# Patient Record
Sex: Female | Born: 1970 | Hispanic: No | Marital: Married | Smoking: Never smoker
Health system: Southern US, Community
[De-identification: ages and names within clinical notes are randomized; demographics above are authoritative.]

## PROBLEM LIST (undated history)

## (undated) DIAGNOSIS — D649 Anemia, unspecified: Secondary | ICD-10-CM

## (undated) DIAGNOSIS — R200 Anesthesia of skin: Secondary | ICD-10-CM

## (undated) DIAGNOSIS — O24419 Gestational diabetes mellitus in pregnancy, unspecified control: Secondary | ICD-10-CM

## (undated) DIAGNOSIS — D569 Thalassemia, unspecified: Secondary | ICD-10-CM

## (undated) DIAGNOSIS — N838 Other noninflammatory disorders of ovary, fallopian tube and broad ligament: Secondary | ICD-10-CM

## (undated) HISTORY — PX: OTHER SURGICAL HISTORY: SHX169

## (undated) HISTORY — PX: TUBAL LIGATION: SHX77

---

## 2010-02-22 ENCOUNTER — Other Ambulatory Visit: Payer: Self-pay | Admitting: Obstetrics and Gynecology

## 2010-02-22 DIAGNOSIS — O09529 Supervision of elderly multigravida, unspecified trimester: Secondary | ICD-10-CM

## 2010-03-02 ENCOUNTER — Ambulatory Visit (HOSPITAL_COMMUNITY)
Admission: RE | Admit: 2010-03-02 | Discharge: 2010-03-02 | Disposition: A | Discharge status: Home / Self Care | Payer: Self-pay | Source: Ambulatory Visit | Attending: Obstetrics and Gynecology | Admitting: Obstetrics and Gynecology

## 2010-03-02 ENCOUNTER — Encounter (HOSPITAL_COMMUNITY): Payer: Self-pay

## 2010-03-02 DIAGNOSIS — O09529 Supervision of elderly multigravida, unspecified trimester: Secondary | ICD-10-CM | POA: Insufficient documentation

## 2010-03-02 DIAGNOSIS — O34219 Maternal care for unspecified type scar from previous cesarean delivery: Secondary | ICD-10-CM | POA: Insufficient documentation

## 2010-03-02 DIAGNOSIS — O3500X0 Maternal care for (suspected) central nervous system malformation or damage in fetus, unspecified, not applicable or unspecified: Secondary | ICD-10-CM | POA: Insufficient documentation

## 2010-03-02 DIAGNOSIS — O350XX0 Maternal care for (suspected) central nervous system malformation in fetus, not applicable or unspecified: Secondary | ICD-10-CM | POA: Insufficient documentation

## 2010-03-02 DIAGNOSIS — Z1389 Encounter for screening for other disorder: Secondary | ICD-10-CM | POA: Insufficient documentation

## 2010-03-02 DIAGNOSIS — O358XX0 Maternal care for other (suspected) fetal abnormality and damage, not applicable or unspecified: Secondary | ICD-10-CM | POA: Insufficient documentation

## 2010-03-02 DIAGNOSIS — Z363 Encounter for antenatal screening for malformations: Secondary | ICD-10-CM | POA: Insufficient documentation

## 2010-03-02 DIAGNOSIS — O0930 Supervision of pregnancy with insufficient antenatal care, unspecified trimester: Secondary | ICD-10-CM | POA: Insufficient documentation

## 2010-03-02 IMAGING — US US OB DETAIL+14 WK
1 series · 14 of 28 positions shown · non-contrast
Comparison: none

[Series 1: us ob detail+14 wk · 14 of 80 slices shown]
[im 3/80]
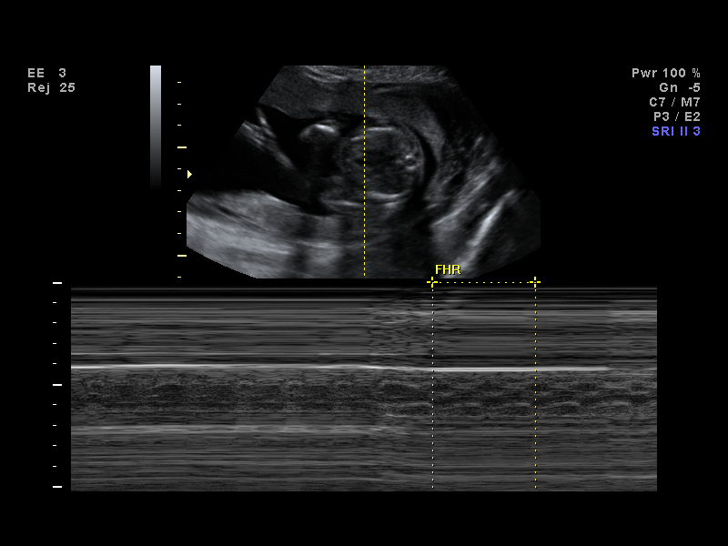
[im 9/80]
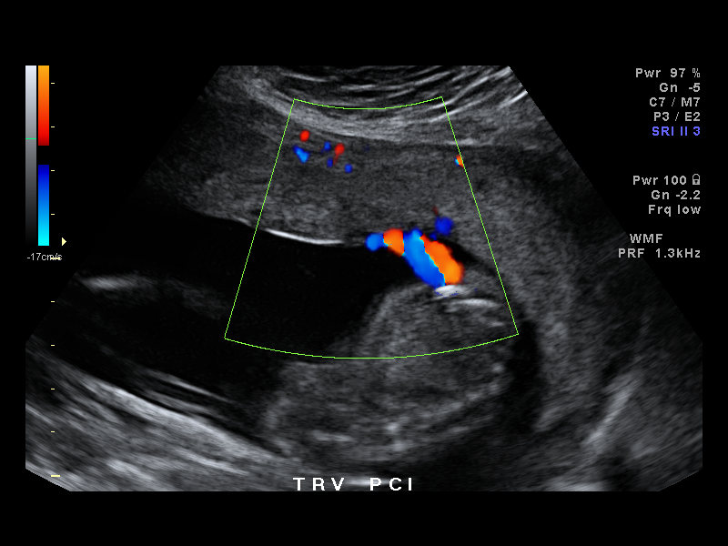
[im 15/80]
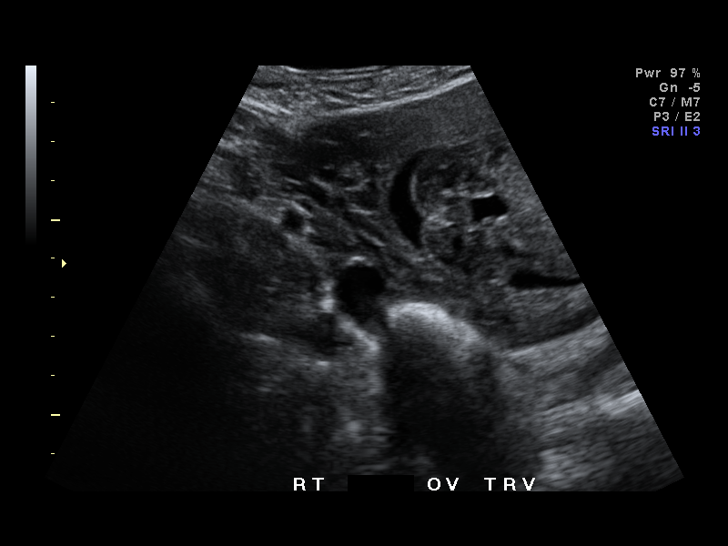
[im 21/80]
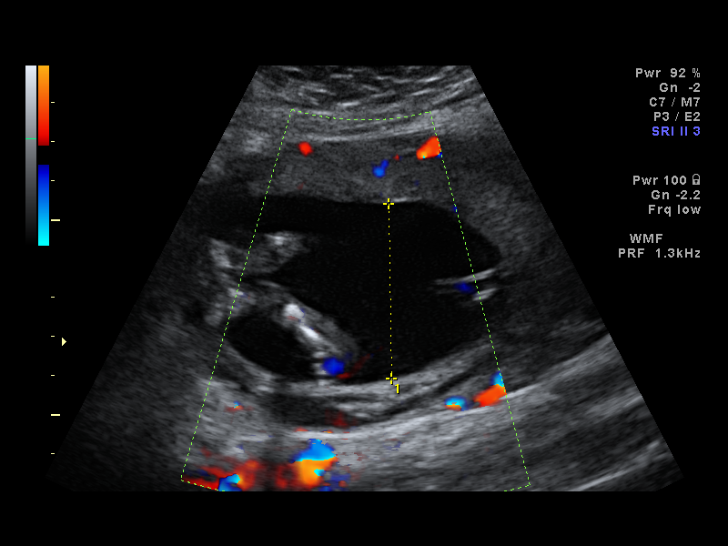
[im 27/80]
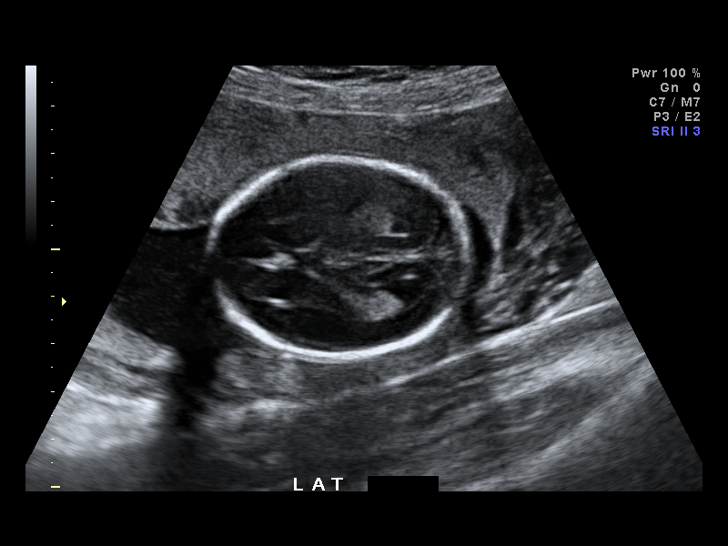
[im 33/80]
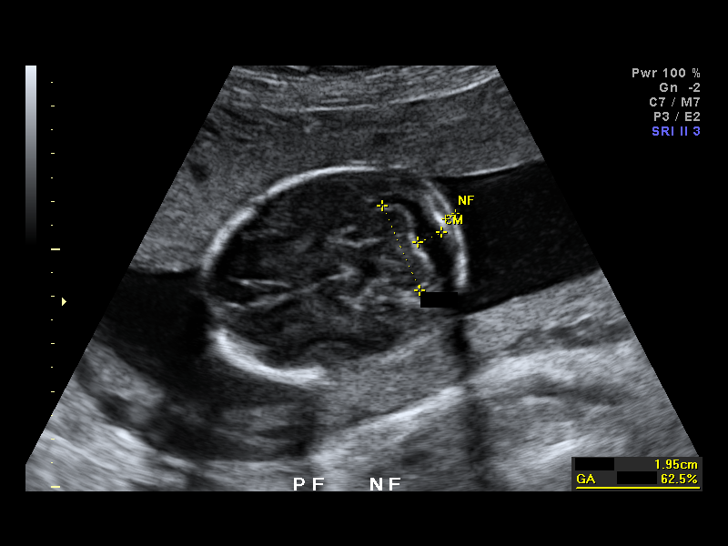
[im 39/80]
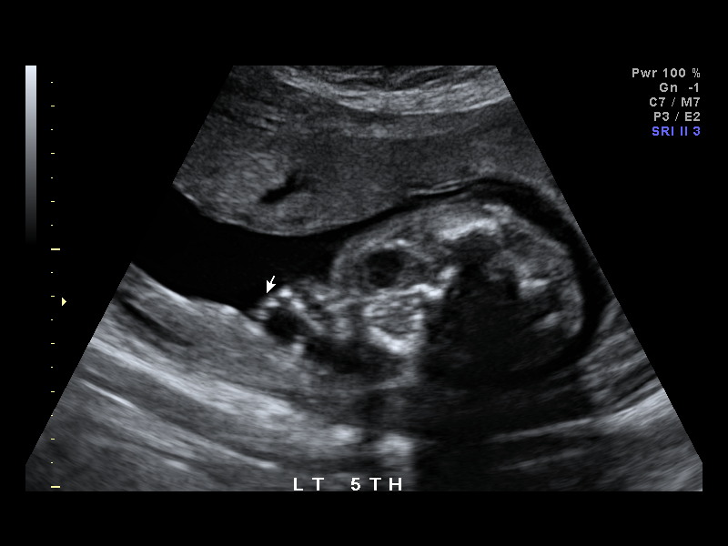
[im 44/80]
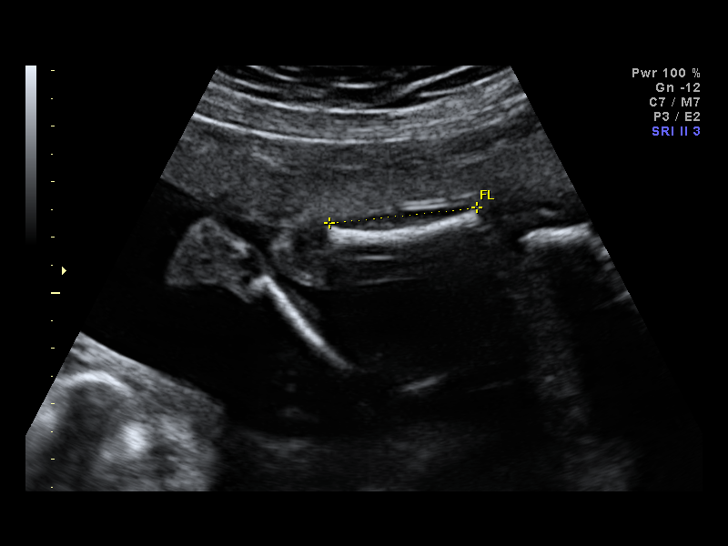
[im 50/80]
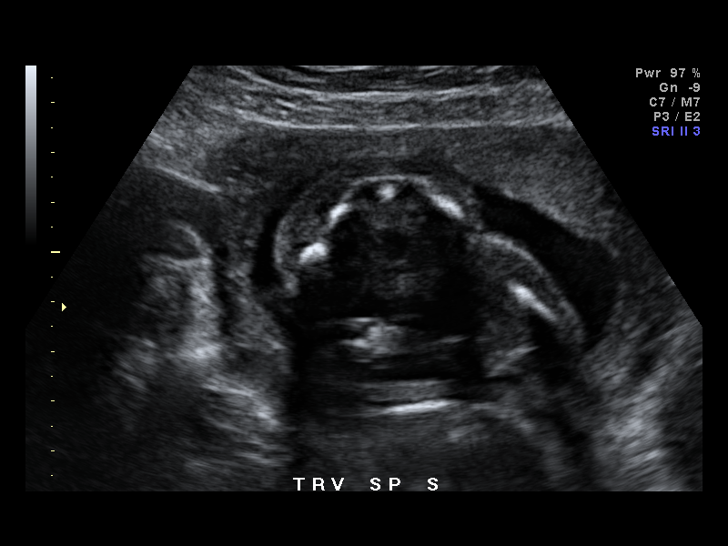
[im 56/80]
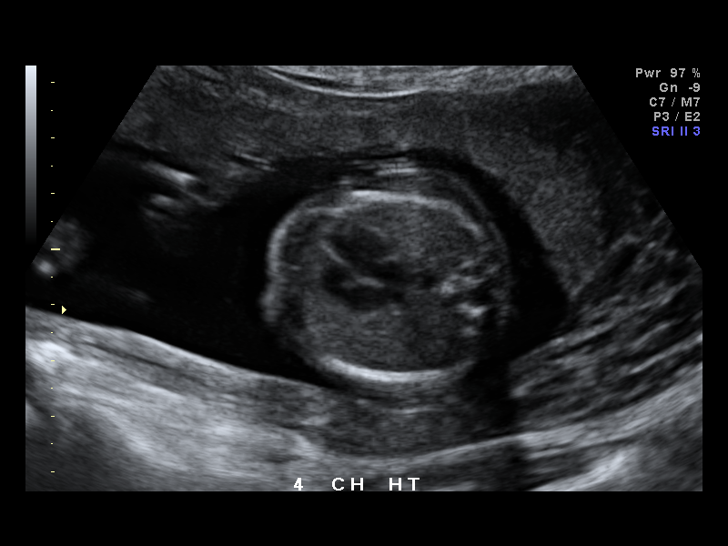
[im 62/80]
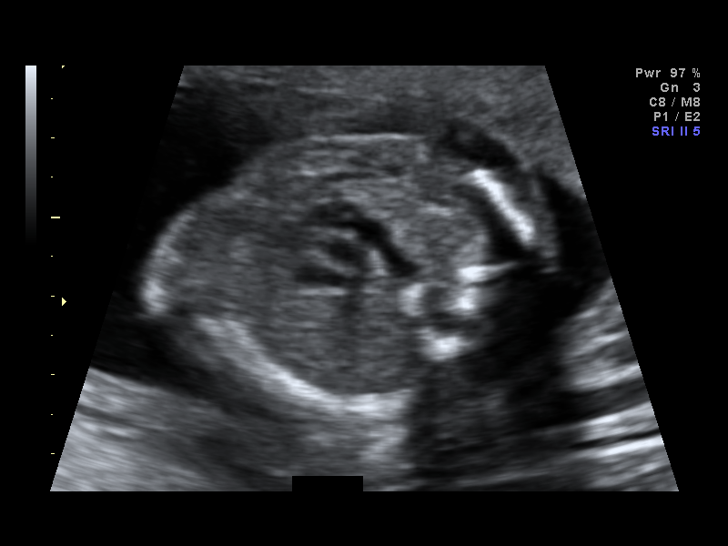
[im 68/80]
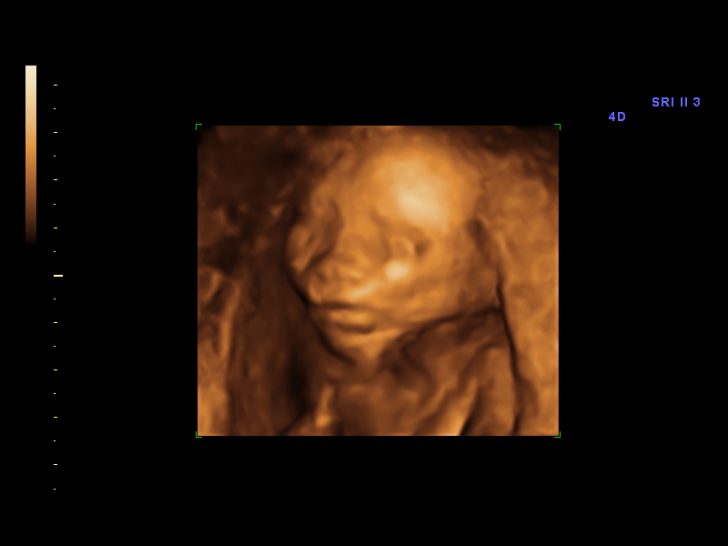
[im 74/80]
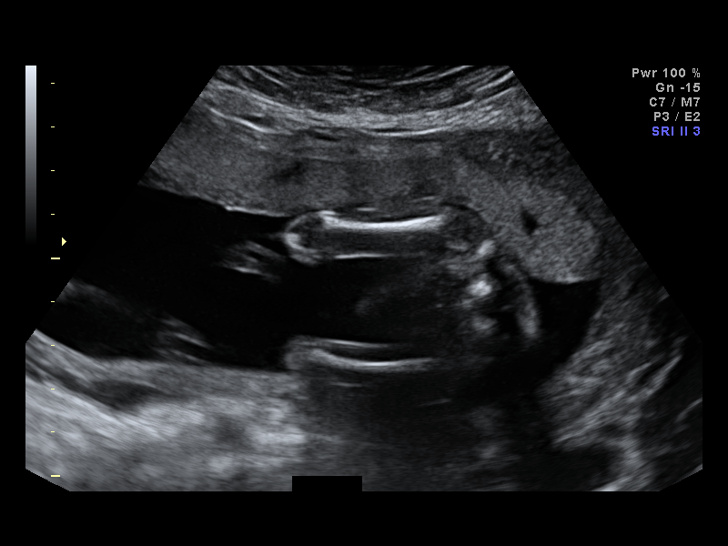
[im 80/80]
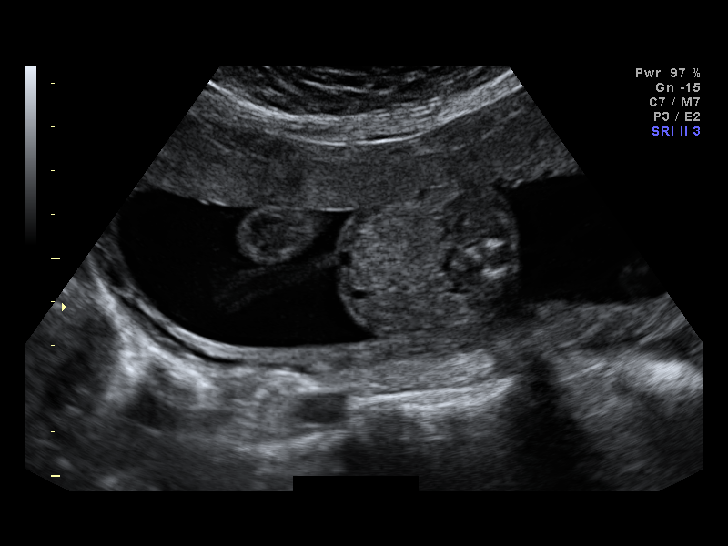

[14 of 28 positions shown; findings below may reference images not displayed]

Canned report from images found in remote index.

Refer to host system for actual result text.

## 2010-07-18 ENCOUNTER — Encounter (HOSPITAL_COMMUNITY): Payer: Self-pay

## 2010-07-18 ENCOUNTER — Encounter (HOSPITAL_COMMUNITY)
Admission: RE | Admit: 2010-07-18 | Discharge: 2010-07-18 | Disposition: A | Discharge status: Home / Self Care | Payer: Medicaid Other | Source: Ambulatory Visit | Attending: Obstetrics and Gynecology | Admitting: Obstetrics and Gynecology

## 2010-07-18 HISTORY — DX: Anemia, unspecified: D64.9

## 2010-07-18 LAB — BASIC METABOLIC PANEL
BUN: 12 mg/dL (ref 6–23)
CO2: 21 mEq/L (ref 19–32)
Chloride: 103 mEq/L (ref 96–112)
Creatinine, Ser: 0.49 mg/dL — ABNORMAL LOW (ref 0.50–1.10)
Potassium: 3.7 mEq/L (ref 3.5–5.1)

## 2010-07-18 LAB — CBC
HCT: 37.9 % (ref 36.0–46.0)
MCHC: 34 g/dL (ref 30.0–36.0)
MCV: 78.1 fL (ref 78.0–100.0)
RBC: 4.85 MIL/uL (ref 3.87–5.11)
RDW: 14.6 % (ref 11.5–15.5)
WBC: 10.9 10*3/uL — ABNORMAL HIGH (ref 4.0–10.5)

## 2010-07-18 NOTE — Patient Instructions (Addendum)
22 Jill Lee  07/18/2010 7/23  Your procedure is scheduled on:  07/24/10   9;45  Report to [facility] at 815 AM.  Call this number if you have problems the morning of surgery: [phone]   Remember:   Do not eat food:After Midnight.  Do not drink clear liquids: After Midnight.  Take these medicines the morning of surgery with A SIP OF WATER: NA   Do not wear jewelry, make-up or nail polish.  Do not bring valuables to the hospital.  Contacts, dentures or bridgework may not be worn into surgery.  Leave suitcase in the car. After surgery it may be brought to your room.  For patients admitted to the hospital, checkout time is 11:00 AM the day of discharge.   Patients discharged the day of surgery will not be allowed to drive home.  Name and phone number of your driver:NA  Special Instructions:

## 2010-07-23 MED ORDER — DEXTROSE 5 % IV SOLN
1.0000 g | INTRAVENOUS | Status: AC
  Administered 2010-07-24: 1 g via INTRAVENOUS
  Filled 2010-07-23: qty 1

## 2010-07-24 ENCOUNTER — Encounter (HOSPITAL_COMMUNITY): Payer: Self-pay | Admitting: Anesthesiology

## 2010-07-24 ENCOUNTER — Encounter (HOSPITAL_COMMUNITY): Payer: Self-pay | Admitting: *Deleted

## 2010-07-24 ENCOUNTER — Inpatient Hospital Stay (HOSPITAL_COMMUNITY)
Admission: EM | Admit: 2010-07-24 | Discharge: 2010-07-26 | DRG: 766 | Disposition: A | Discharge status: Home / Self Care | Payer: Medicaid Other | Source: Ambulatory Visit | Attending: Obstetrics and Gynecology | Admitting: Obstetrics and Gynecology

## 2010-07-24 ENCOUNTER — Inpatient Hospital Stay (HOSPITAL_COMMUNITY): Payer: Medicaid Other | Admitting: Anesthesiology

## 2010-07-24 ENCOUNTER — Encounter (HOSPITAL_COMMUNITY)
Admission: EM | Disposition: A | Discharge status: Home / Self Care | Payer: Self-pay | Source: Ambulatory Visit | Attending: Obstetrics and Gynecology

## 2010-07-24 ENCOUNTER — Other Ambulatory Visit: Payer: Self-pay | Admitting: Obstetrics and Gynecology

## 2010-07-24 DIAGNOSIS — D569 Thalassemia, unspecified: Secondary | ICD-10-CM

## 2010-07-24 DIAGNOSIS — Z01818 Encounter for other preprocedural examination: Secondary | ICD-10-CM

## 2010-07-24 DIAGNOSIS — Z302 Encounter for sterilization: Secondary | ICD-10-CM

## 2010-07-24 DIAGNOSIS — O34219 Maternal care for unspecified type scar from previous cesarean delivery: Principal | ICD-10-CM | POA: Diagnosis present

## 2010-07-24 DIAGNOSIS — O99814 Abnormal glucose complicating childbirth: Secondary | ICD-10-CM | POA: Diagnosis present

## 2010-07-24 DIAGNOSIS — Z01812 Encounter for preprocedural laboratory examination: Secondary | ICD-10-CM

## 2010-07-24 DIAGNOSIS — O09529 Supervision of elderly multigravida, unspecified trimester: Secondary | ICD-10-CM | POA: Diagnosis present

## 2010-07-24 LAB — GLUCOSE, CAPILLARY: Glucose-Capillary: 89 mg/dL (ref 70–99)

## 2010-07-24 LAB — ABO/RH: ABO/RH(D): AB POS

## 2010-07-24 SURGERY — Surgical Case
Anesthesia: Spinal

## 2010-07-24 MED ORDER — OXYTOCIN 10 UNIT/ML IJ SOLN
INTRAMUSCULAR | Status: AC
  Filled 2010-07-24: qty 2

## 2010-07-24 MED ORDER — EPHEDRINE 5 MG/ML INJ
INTRAVENOUS | Status: AC
  Filled 2010-07-24: qty 10

## 2010-07-24 MED ORDER — DIPHENHYDRAMINE HCL 50 MG/ML IJ SOLN: 12.5000 mg | INTRAMUSCULAR | Status: DC | PRN

## 2010-07-24 MED ORDER — METOCLOPRAMIDE HCL 5 MG/ML IJ SOLN: 10.0000 mg | Freq: Three times a day (TID) | INTRAMUSCULAR | Status: DC | PRN

## 2010-07-24 MED ORDER — NALBUPHINE HCL 10 MG/ML IJ SOLN
5.0000 mg | INTRAMUSCULAR | Status: AC | PRN
Start: 2010-07-24 — End: 2010-07-25
  Administered 2010-07-25: 5 mg via INTRAVENOUS
  Filled 2010-07-24 (×2): qty 1

## 2010-07-24 MED ORDER — SIMETHICONE 80 MG PO CHEW: 80.0000 mg | CHEWABLE_TABLET | Freq: Three times a day (TID) | ORAL | Status: DC | PRN

## 2010-07-24 MED ORDER — DIPHENHYDRAMINE HCL 25 MG PO CAPS: 25.0000 mg | ORAL_CAPSULE | ORAL | Status: DC | PRN

## 2010-07-24 MED ORDER — ONDANSETRON HCL 4 MG/2ML IJ SOLN
INTRAMUSCULAR | Status: AC
  Filled 2010-07-24: qty 2

## 2010-07-24 MED ORDER — LACTATED RINGERS IV SOLN: INTRAVENOUS | Status: DC | PRN

## 2010-07-24 MED ORDER — FENTANYL CITRATE 0.05 MG/ML IJ SOLN
INTRAMUSCULAR | Status: AC
  Filled 2010-07-24: qty 2

## 2010-07-24 MED ORDER — PANTOPRAZOLE SODIUM 40 MG PO TBEC: 40.0000 mg | DELAYED_RELEASE_TABLET | Freq: Once | ORAL | Status: DC

## 2010-07-24 MED ORDER — SCOPOLAMINE 1 MG/3DAYS TD PT72
MEDICATED_PATCH | TRANSDERMAL | Status: AC
  Administered 2010-07-24: 1.5 mg
  Filled 2010-07-24: qty 1

## 2010-07-24 MED ORDER — PHENYLEPHRINE HCL 10 MG/ML IJ SOLN
INTRAMUSCULAR | Status: DC | PRN
  Administered 2010-07-24 (×5): 40 ug via INTRAVENOUS

## 2010-07-24 MED ORDER — SIMETHICONE 80 MG PO CHEW
80.0000 mg | CHEWABLE_TABLET | Freq: Three times a day (TID) | ORAL | Status: DC
  Administered 2010-07-24 – 2010-07-26 (×5): 80 mg via ORAL

## 2010-07-24 MED ORDER — SODIUM CHLORIDE 0.9 % IR SOLN
Status: DC | PRN
  Administered 2010-07-24: 1000 mL

## 2010-07-24 MED ORDER — HYDROMORPHONE HCL 1 MG/ML IJ SOLN
0.5000 mg | Freq: Once | INTRAMUSCULAR | Status: AC
  Administered 2010-07-24: 0.5 mg via INTRAVENOUS

## 2010-07-24 MED ORDER — SODIUM CHLORIDE 0.9 % IV SOLN
1.0000 ug/kg/h | INTRAVENOUS | Status: DC | PRN
  Filled 2010-07-24: qty 2.5

## 2010-07-24 MED ORDER — OXYTOCIN 10 UNIT/ML IJ SOLN
INTRAMUSCULAR | Status: DC | PRN
  Administered 2010-07-24 (×2): 20 [IU]

## 2010-07-24 MED ORDER — HYDROMORPHONE HCL 1 MG/ML IJ SOLN
INTRAMUSCULAR | Status: AC
  Administered 2010-07-24: 0.5 mg via INTRAVENOUS
  Filled 2010-07-24: qty 1

## 2010-07-24 MED ORDER — MEPERIDINE HCL 25 MG/ML IJ SOLN: 6.2500 mg | INTRAMUSCULAR | Status: DC | PRN

## 2010-07-24 MED ORDER — LACTATED RINGERS IV SOLN
INTRAVENOUS | Status: DC | PRN
  Administered 2010-07-24: 11:00:00 via INTRAVENOUS

## 2010-07-24 MED ORDER — MORPHINE SULFATE 0.5 MG/ML IJ SOLN
INTRAMUSCULAR | Status: AC
  Filled 2010-07-24: qty 10

## 2010-07-24 MED ORDER — NALOXONE HCL 0.4 MG/ML IJ SOLN: 0.4000 mg | INTRAMUSCULAR | Status: DC | PRN

## 2010-07-24 MED ORDER — FAMOTIDINE 20 MG PO TABS: 20.0000 mg | ORAL_TABLET | Freq: Once | ORAL | Status: DC

## 2010-07-24 MED ORDER — NALBUPHINE HCL 10 MG/ML IJ SOLN
5.0000 mg | INTRAMUSCULAR | Status: AC | PRN
  Filled 2010-07-24: qty 1

## 2010-07-24 MED ORDER — PHENYLEPHRINE 40 MCG/ML (10ML) SYRINGE FOR IV PUSH (FOR BLOOD PRESSURE SUPPORT)
PREFILLED_SYRINGE | INTRAVENOUS | Status: AC
  Filled 2010-07-24: qty 15

## 2010-07-24 MED ORDER — LACTATED RINGERS IV SOLN
INTRAVENOUS | Status: DC
  Administered 2010-07-24: 11:00:00 via INTRAVENOUS
  Administered 2010-07-24: 1000 mL via INTRAVENOUS
  Administered 2010-07-24 (×4): via INTRAVENOUS

## 2010-07-24 MED ORDER — DIPHENHYDRAMINE HCL 50 MG/ML IJ SOLN: 25.0000 mg | INTRAMUSCULAR | Status: DC | PRN

## 2010-07-24 MED ORDER — SODIUM CHLORIDE 0.9 % IJ SOLN: 3.0000 mL | INTRAMUSCULAR | Status: DC | PRN

## 2010-07-24 MED ORDER — LACTATED RINGERS IV SOLN
INTRAVENOUS | Status: DC
  Administered 2010-07-24 – 2010-07-25 (×2): via INTRAVENOUS

## 2010-07-24 MED ORDER — SENNOSIDES-DOCUSATE SODIUM 8.6-50 MG PO TABS
1.0000 | ORAL_TABLET | Freq: Every day | ORAL | Status: DC
  Administered 2010-07-24: 2 via ORAL

## 2010-07-24 MED ORDER — ONDANSETRON HCL 4 MG/2ML IJ SOLN
INTRAMUSCULAR | Status: DC | PRN
  Administered 2010-07-24: 4 mg via INTRAVENOUS

## 2010-07-24 MED ORDER — EPHEDRINE SULFATE 50 MG/ML IJ SOLN
INTRAMUSCULAR | Status: DC | PRN
  Administered 2010-07-24: 10 mg via INTRAVENOUS
  Administered 2010-07-24: 5 mg via INTRAVENOUS
  Administered 2010-07-24: 10 mg via INTRAVENOUS
  Administered 2010-07-24: 5 mg via INTRAVENOUS

## 2010-07-24 MED ORDER — CITRIC ACID-SODIUM CITRATE 334-500 MG/5ML PO SOLN: 30.0000 mL | Freq: Once | ORAL | Status: DC

## 2010-07-24 MED ORDER — ONDANSETRON HCL 4 MG/2ML IJ SOLN: 4.0000 mg | Freq: Three times a day (TID) | INTRAMUSCULAR | Status: DC | PRN

## 2010-07-24 SURGICAL SUPPLY — 31 items
APPLICATOR CHLORAPREP 3ML ORNG (MISCELLANEOUS) ×2 IMPLANT
CLOTH BEACON ORANGE TIMEOUT ST (SAFETY) ×2 IMPLANT
DERMABOND ADVANCED (GAUZE/BANDAGES/DRESSINGS) IMPLANT
DRAPE UTILITY XL STRL (DRAPES) ×2 IMPLANT
DRESSING TELFA 8X3 (GAUZE/BANDAGES/DRESSINGS) IMPLANT
DURAPREP 26ML APPLICATOR (WOUND CARE) ×2 IMPLANT
ELECT REM PT RETURN 9FT ADLT (ELECTROSURGICAL) ×2
ELECTRODE REM PT RTRN 9FT ADLT (ELECTROSURGICAL) ×1 IMPLANT
EXTRACTOR VACUUM M CUP 4 TUBE (SUCTIONS) IMPLANT
GAUZE SPONGE 4X4 12PLY STRL LF (GAUZE/BANDAGES/DRESSINGS) IMPLANT
GLOVE BIO SURGEON STRL SZ 6.5 (GLOVE) ×2 IMPLANT
GLOVE BIOGEL PI IND STRL 7.0 (GLOVE) ×1 IMPLANT
GLOVE BIOGEL PI INDICATOR 7.0 (GLOVE) ×1
GOWN BRE IMP SLV AUR LG STRL (GOWN DISPOSABLE) ×4 IMPLANT
GOWN BRE IMP SLV AUR XL STRL (GOWN DISPOSABLE) ×2 IMPLANT
KIT ABG SYR 3ML LUER SLIP (SYRINGE) IMPLANT
NEEDLE HYPO 25X5/8 SAFETYGLIDE (NEEDLE) IMPLANT
NS IRRIG 1000ML POUR BTL (IV SOLUTION) ×2 IMPLANT
PACK C SECTION WH (CUSTOM PROCEDURE TRAY) ×2 IMPLANT
PAD ABD 7.5X8 STRL (GAUZE/BANDAGES/DRESSINGS) IMPLANT
RTRCTR C-SECT PINK 25CM LRG (MISCELLANEOUS) IMPLANT
SLEEVE SCD COMPRESS KNEE MED (MISCELLANEOUS) IMPLANT
SUT CHROMIC 2 0 CT 1 (SUTURE) ×2 IMPLANT
SUT PLAIN 2 0 (SUTURE)
SUT PLAIN 2 0 XLH (SUTURE) ×2 IMPLANT
SUT PLAIN ABS 2-0 54XMFL TIE (SUTURE) IMPLANT
SUT VIC AB 0 CT1 36 (SUTURE) ×8 IMPLANT
SUT VIC AB 4-0 KS 27 (SUTURE) ×2 IMPLANT
TOWEL OR 17X24 6PK STRL BLUE (TOWEL DISPOSABLE) ×4 IMPLANT
TRAY FOLEY CATH 14FR (SET/KITS/TRAYS/PACK) IMPLANT
WATER STERILE IRR 1000ML POUR (IV SOLUTION) ×2 IMPLANT

## 2010-07-24 NOTE — Progress Notes (Signed)
UR chart review completed.  

## 2010-07-24 NOTE — Anesthesia Procedure Notes (Addendum)
Spinal Block  Patient location during procedure: OR Staffing Anesthesiologist: JACKSON, KYLE EDWARD Performed by: anesthesiologist  Preanesthetic Checklist Completed: patient identified, site marked, surgical consent, pre-op evaluation, timeout performed, IV checked, risks and benefits discussed and monitors and equipment checked Spinal Block Patient position: sitting Prep: DuraPrep Patient monitoring: heart rate, cardiac monitor, continuous pulse ox and blood pressure Approach: midline Location: L3-4 Injection technique: single-shot Needle Needle type: Sprotte  Needle gauge: 24 G Needle length: 9 cm Assessment Sensory level: T4 Additional Notes Spinal Dosage in OR  Bupivicaine ml       1.4 PFMS04   mcg        150 Fentanyl mcg            20    

## 2010-07-24 NOTE — Anesthesia Preprocedure Evaluation (Signed)
Anesthesia Evaluation  Name, MR# and DOB Patient awake  General Assessment Comment  Reviewed: Allergy & Precautions, H&P  and Patient's Chart, lab work & pertinent test results  History of Anesthesia Complications Negative for: history of anesthetic complications  Airway Mallampati: II TM Distance: >3 FB Neck ROM: full    Dental  (+) Teeth Intact and Chipped   Pulmonaryneg pulmonary ROS      pulmonary exam normal   Cardiovascular regular Normal   Neuro/PsychBack pain with pregnancy Negative Neurological ROS Negative Psych ROS  GI/Hepatic/Renal negative GI ROS, negative Liver ROS, and negative Renal ROS (+)       Endo/Other   (+) Diabetes mellitus-, Well Controlled, Gestational  Abdominal   Musculoskeletal  Hematology  (+) Blood dyscrasia (thalassemia per chart - pt unaware of this diagnosis), ,   Peds  Reproductive/Obstetrics (+) Pregnancy   Anesthesia Other Findings Admits to toothache and cracked rear molars, but denies loose teeth            Anesthesia Physical Anesthesia Plan  ASA: II  Anesthesia Plan: Spinal   Post-op Pain Management:    Induction:   Airway Management Planned:   Additional Equipment:   Intra-op Plan:   Post-operative Plan:   Informed Consent: I have reviewed the patients History and Physical, chart, labs and discussed the procedure including the risks, benefits and alternatives for the proposed anesthesia with the patient or authorized representative who has indicated his/her understanding and acceptance.     Plan Discussed with:   Anesthesia Plan Comments:         Anesthesia Quick Evaluation

## 2010-07-24 NOTE — Anesthesia Postprocedure Evaluation (Signed)
  Anesthesia Post-op Note  Patient: Jill Lee  Procedure(s) Performed:  CESAREAN SECTION WITH BILATERAL TUBAL LIGATION - Repeat   No anesthesia complications.  Level of consciousness: alert. Cardiopulmonary status stable.  No follow-up care or observation required.  Jontae Adebayo L. Rodman Pickle, MD

## 2010-07-24 NOTE — Transfer of Care (Addendum)
Immediate Anesthesia Transfer of Care Note  Patient: Jill Lee  Procedure(s) Performed:  CESAREAN SECTION WITH BILATERAL TUBAL LIGATION - Repeat   Patient Location: PACU  Anesthesia Type: Spinal  Level of Consciousness: awake, alert  and oriented  Airway & Oxygen Therapy: Patient Spontanous Breathing  Post-op Assessment: Report given to PACU RN  Post vital signs: Reviewed and stable  Complications: No apparent anesthesia complications

## 2010-07-24 NOTE — Consult Note (Signed)
Called to attend scheduled repeat C/section at 39+wks EGA after uncomplicated pregnancy.  No labor, AROM with clear fluid at delivery.  Vertex extraction.  Infant vigorous -  No resuscitation needed. Wrapped and shown to mother, then taken to CN for further care per Dr. Anderson/[facility].  JWimmer,MD

## 2010-07-25 MED ORDER — ACETAMINOPHEN-CODEINE #3 300-30 MG PO TABS: 2.0000 | ORAL_TABLET | ORAL | Status: DC | PRN

## 2010-07-25 MED ORDER — ACETAMINOPHEN-CODEINE #3 300-30 MG PO TABS
1.0000 | ORAL_TABLET | ORAL | Status: DC | PRN
  Administered 2010-07-25 – 2010-07-26 (×5): 1 via ORAL
  Filled 2010-07-25 (×5): qty 1

## 2010-07-25 MED ORDER — BISACODYL 10 MG RE SUPP
10.0000 mg | Freq: Once | RECTAL | Status: AC
  Administered 2010-07-25: 10 mg via RECTAL
  Filled 2010-07-25: qty 1

## 2010-07-25 NOTE — Op Note (Signed)
Jill Lee, Jill Lee          ACCOUNT NO.:  [id number]  MEDICAL RECORD NO.:  [id number]  LOCATION:  9104                          FACILITY:  [facility]  PHYSICIAN:  Arlyce Harman, MD     DATE OF BIRTH:  [birth year]  DATE OF PROCEDURE:  07/24/2010 DATE OF DISCHARGE:                              OPERATIVE REPORT   PREOPERATIVE DIAGNOSES: 1. Term pregnancy 39+ weeks. 2. Sterilization. 3. Gestatational diabetes. 4. Advanced maternal age. 5. Elevated alpha-fetoprotein.  POSTOPERATIVE DIAGNOSES: 1. Previous cesarean section. 2. Gestational diabetes. 3. Elective sterilization. 4. Advanced maternal age. 5. Elevated alpha-fetoprotein.  SURGEON:  Arlyce Harman, MD  ASSISTANT:  Liberty Handy, MD  INDICATIONS AND CONCERNS:  The patient is a 40 year old gravida 2, para 1 female whose due date is July 29, 2010.  Prenatal course has been complicated by advanced maternal age, gestational diabetes insulin controlled, and an elevated alpha-fetoprotein test.  In addition, the patient has requested elective sterilization.  Options for delivery have been discussed.  Risks and possible complications of the VBAC versus repeat C-section have been discussed and informed consent is given for repeat C-section.  The patient is requesting postpartum sterilization. Risks, possible complications, failure rate, and irreversibility have been explained and informed consent is given for repeat C-section and tubal ligation.  PROCEDURE IN DETAIL:  The patient was placed on the table in a supine position with wedging after spinal anesthesia had been induced.  The abdomen was sterilely prepped and draped and the Foley catheter was inserted into the bladder.  The skin was entered in a Pfannenstiel fashion and the layers were extended into the abdomen without difficulty.  The uterus was entered in a low-transverse fashion.  The membranes were brought forth and ruptured, the fluid was clear.  The infant's  head was delivered and the nose and mouth was suctioned.  There was a cord draped around the shoulder loosely x1.  The infant was delivered.  It was a female.  The cord was clamped and cut and the infant was handed off to the pediatrician.  The placenta was manually removed. The uterus was sponged out.  At this point, the uterus was closed. First layer was a 0-Vicryl suture in a running locked fashion.  The second layer was a 0-Vicryl suture in a modified Lembert fashion.  A separate stitch was placed on the left angle of the incision for hemostasis and hemostasis was excellent.  The peritoneum was closed with 2-0 Vicryl in a running fashion.  The abdomen was irrigated and suctioned out.  Hemostasis was excellent.  The tubal ligation was then done.  The left tube was grasped with a Babcock clamp and 2-0 plain sutures were used to tie a knuckle of tube down.  A second 2-0 plain tie was placed and the tube was incised and portion of the tube excised and handed off.  The exact procedure was done on the right side and the uterus was gently replaced into the abdomen.  Again, the pelvis was irrigated and suctioned out.  The parietal peritoneum was then closed with 0-Vicryl in a running fashion.  The fascia was closed with 0-Vicryl in a running locked fashion.  The subcu was closed with 2-0  plain in a running fashion and the skin was approximated in a subcuticular fashion using 4-0 Vicryl suture.  At this point, the procedure was terminated and the patient was awakened and taken to recovery in good condition.  ESTIMATED BLOOD LOSS:  750 mL.  SPECIMEN:  Sent to labor and delivery  DISPOSITION:  Sent to labor and delivery.     Arlyce Harman, MD     EG/MEDQ  D:  07/24/2010  T:  07/25/2010  Job:  [id number]

## 2010-07-25 NOTE — Progress Notes (Signed)
Mom reports breastfeeding going well. Assisted with deeper latch. Lactation brochure reviewed with mom, community resources for breastfeeding mom discussed, advised of outpatient services if needed.

## 2010-07-25 NOTE — Progress Notes (Signed)
Subjective: Postpartum Day 1: Cesarean Delivery Patient reports tolerating PO.    Objective: Vital signs in last 24 hours: Temp:  [97.6 F (36.4 C)-98.4 F (36.9 C)] 98.4 F (36.9 C) (07/24 1100) Pulse Rate:  [69-83] 83  (07/24 1100) Resp:  [16-20] 18  (07/24 1100) BP: (94-118)/(56-75) 118/75 mmHg (07/24 1100) SpO2:  [95 %-100 %] 99 % (07/24 1100) Weight:  [65.772 kg (145 lb)] 145 lb (65.772 kg) (07/23 1547)  Physical Exam:  General: alert and no distress Lochia: appropriate Uterine Fundus: firm Incision: healing well DVT Evaluation: No evidence of DVT seen on physical exam.  No results found for this basename: HGB:2,HCT:2 in the last 72 hours  Assessment/Plan: Status post Cesarean section. Doing well postoperatively.  Continue current care.  Fortino Sic 07/25/2010, 1:38 PM

## 2010-07-26 NOTE — Progress Notes (Signed)
Patient would like to be discharged early today.  Infant has been discharged.  Notified Dr. Neva Seat who stated she was on her way to the hospital and would see the patient shortly.  Earl Gala, Linda Hedges [place]

## 2010-07-26 NOTE — Progress Notes (Signed)

## 2010-07-26 NOTE — Discharge Summary (Signed)
Obstetric Discharge Summary Reason for Admission: cesarean section Prenatal Procedures: none Intrapartum Procedures: cesarean: low cervical, transverse Postpartum Procedures: P.P. tubal ligation Complications-Operative and Postpartum: none  Hemoglobin  Date Value Range Status  07/18/2010 12.9  12.0-15.0 (g/dL) Final     HCT  Date Value Range Status  07/18/2010 37.9  36.0-46.0 (%) Final    Discharge Diagnoses: Term Pregnancy-delivered  Discharge Information: Date: 07/26/2010 Activity: pelvic rest Diet: routine Medications: Tylenol #3 Condition: stable Instructions: refer to practice specific booklet Discharge to: home   Newborn Data: Live born  Information for the patient's newborn:  Dexter, Sauser Boy Kaylea [[id number]]  female ; APGAR , ; weight ;  Home with mother.  Renly Roots E 07/26/2010, 3:35 PM

## 2010-07-26 NOTE — Progress Notes (Signed)
Subjective: Postpartum Day 2 Cesarean Delivery Patient reports no problems voiding.    Objective: Vital signs in last 24 hours: Temp:  [97.7 F (36.5 C)-98.4 F (36.9 C)] 97.7 F (36.5 C) (07/25 0603) Pulse Rate:  [78-87] 79  (07/25 0603) Resp:  [18-20] 18  (07/25 0603) BP: (107-113)/(69-75) 113/75 mmHg (07/25 0603)  Physical Exam:  General: alert Lochia: appropriate Uterine Fundus: firm Incision: healing well DVT Evaluation: No evidence of DVT seen on physical exam.  No results found for this basename: HGB:2,HCT:2 in the last 72 hours  Assessment/Plan: Status post Cesarean section. Doing well postoperatively.  Discharge home with standard precautions and return to clinic in 4-6 weeks.  Falon Flinchum E 07/26/2010, 1:34 PM

## 2010-09-15 ENCOUNTER — Encounter (HOSPITAL_COMMUNITY): Payer: Self-pay | Admitting: *Deleted

## 2013-11-02 ENCOUNTER — Encounter (HOSPITAL_COMMUNITY): Payer: Self-pay | Admitting: *Deleted

## 2015-06-08 NOTE — Progress Notes (Deleted)
Consult Note: Gyn-Onc  Consult was requested by Dr. Marland Kitchen for the evaluation of Jill Lee 45 y.o. female  CC: No chief complaint on file.   Assessment/Plan:  Ms. Jill Lee is a 45 y.o.    HPI: Ms. Jill Lee is a 45 y.o.   ***  Review of Systems:  Constitutional  Feels well,  *** Cardiovascular  No chest pain, shortness of breath, or edema  Pulmonary  No cough or wheeze.  Gastro Intestinal  No nausea, vomitting, or diarrhoea. No bright red blood per rectum, no abdominal pain, change in bowel movement, or constipation.  Genito Urinary  No frequency, urgency, dysuria, *** Musculo Skeletal  No myalgia, arthralgia, joint swelling or pain  Neurologic  No weakness, numbness, change in gait,  Psychology  No depression, anxiety, insomnia.    Current Meds:  Outpatient Encounter Prescriptions as of 06/09/2015  Medication Sig  . ferrous sulfate 325 (65 FE) MG tablet Take 325 mg by mouth daily with breakfast.     No facility-administered encounter medications on file as of 06/09/2015.    Allergy:  Allergies  Allergen Reactions  . Motrin [Ibuprofen] Itching  . Shellfish Allergy Rash    Social Hx:   Social History   Social History  . Marital Status: Married    Spouse Name: N/A  . Number of Children: N/A  . Years of Education: N/A   Occupational History  . Not on file.   Social History Main Topics  . Smoking status: Never Smoker   . Smokeless tobacco: Not on file  . Alcohol Use: No  . Drug Use: No  . Sexual Activity: Not on file   Other Topics Concern  . Not on file   Social History Narrative    Past Surgical Hx:  Past Surgical History  Procedure Laterality Date  . Cesarean section  07    Past Medical Hx:  Past Medical History  Diagnosis Date  . Diabetes mellitus   . Anemia     Past Gynecological History:  *** No LMP recorded.  Family Hx: No family history on file.  Vitals:  unknown if currently breastfeeding.  Physical  Exam: WD in NAD Neck  Supple NROM, without any enlargements.  Lymph Node Survey No cervical supraclavicular or inguinal adenopathy Cardiovascular  Pulse normal rate, regularity and rhythm. S1 and S2 normal.  Lungs  Clear to auscultation bilaterally, without wheezes/crackles/rhonchi. Good air movement.  Skin  No rash/lesions/breakdown  Psychiatry  Alert and oriented appropriate mood affect speech and reasoning. Abdomen  Normoactive bowel sounds, abdomen soft, non-tender. Surgical  sites intact without evidence of hernia.  Back No CVA tenderness Genito Urinary  Vulva/vagina: Normal external female genitalia.  No lesions. No discharge or bleeding.  Bladder/urethra:  No lesions or masses  Vagina: ***  Cervix: Normal appearing, no lesions.  Uterus: Mobile, no parametrial involvement or nodularity.  Adnexa: No palpable masses. Rectal  Good tone, no masses no cul de sac nodularity.  Extremities  No bilateral cyanosis, clubbing or edema.

## 2015-06-09 ENCOUNTER — Ambulatory Visit: Payer: Medicaid Other | Attending: Gynecologic Oncology | Admitting: Gynecologic Oncology

## 2015-06-10 NOTE — Progress Notes (Signed)
This encounter was created in error - please disregard.

## 2015-06-27 ENCOUNTER — Ambulatory Visit: Payer: BLUE CROSS/BLUE SHIELD | Attending: Gynecologic Oncology | Admitting: Gynecologic Oncology

## 2015-06-27 ENCOUNTER — Encounter: Payer: Self-pay | Admitting: Gynecologic Oncology

## 2015-06-27 VITALS — BP 123/84 | HR 74 | Temp 98.7°F | Resp 18 | Wt 128.0 lb

## 2015-06-27 DIAGNOSIS — N838 Other noninflammatory disorders of ovary, fallopian tube and broad ligament: Secondary | ICD-10-CM | POA: Insufficient documentation

## 2015-06-27 DIAGNOSIS — E119 Type 2 diabetes mellitus without complications: Secondary | ICD-10-CM | POA: Insufficient documentation

## 2015-06-27 DIAGNOSIS — Z888 Allergy status to other drugs, medicaments and biological substances status: Secondary | ICD-10-CM | POA: Diagnosis not present

## 2015-06-27 DIAGNOSIS — Z91013 Allergy to seafood: Secondary | ICD-10-CM | POA: Insufficient documentation

## 2015-06-27 DIAGNOSIS — Z9851 Tubal ligation status: Secondary | ICD-10-CM | POA: Diagnosis not present

## 2015-06-27 DIAGNOSIS — N839 Noninflammatory disorder of ovary, fallopian tube and broad ligament, unspecified: Secondary | ICD-10-CM

## 2015-06-27 DIAGNOSIS — Z9889 Other specified postprocedural states: Secondary | ICD-10-CM | POA: Insufficient documentation

## 2015-06-27 DIAGNOSIS — N83202 Unspecified ovarian cyst, left side: Secondary | ICD-10-CM | POA: Insufficient documentation

## 2015-06-27 NOTE — Progress Notes (Signed)
Consult Note: Gyn-Onc  Consult was requested by Dr. Nyoka Cowden for the evaluation of Jill Lee 45 y.o. female  CC:  Chief Complaint  Patient presents with  . Ovarian Mass    New patient    Assessment/Plan:  Jill Lee  is a 45 y.o.  year old with a large left ovarian cystic mass and normal CA 125.  I have a low suspicion for malignancy given its largely uniform appearance, and large size and normal tumor marker. However, given its size I recommend removal with frozen section and staging (including hysterectomy) if malignancy is identified.  The patient has completed child bearing and is s/p tubal ligation. She does not desire future fertility.  I discussed operative risks including  bleeding, infection, damage to internal organs (such as bladder,ureters, bowels), blood clot, reoperation and rehospitalization.  Surgery is scheduled for 07/12/15.  HPI: Jill Lee ("Nam") is a 45 year old G2P2 [place] who is seen in consultation at the request of Dr Grayland Ormond in [place] for a left ovarian mass. The patient reports noticing a protrusion or mass in her lower abdomen in May 2017. This prompted her to be evaluated by Dr. Viona Gilmore who performed a transvaginal pelvic ultrasound scan on 05/25/2015. It showed a grossly normal uterus measuring 8.9 x 3.9 x 4.9 cm, and a right ovary measuring 2.9 x 2.8 x 1.8 cm which was normal in appearance. The left ovary contained a complex 13 x 11.2 x 12.5 cm adnexal cystic mass with eccentric 4.2 x 3.0 x 4.4 cm daughter cyst with thick irregular wall and questionable internal vascularity on Doppler. There was no free fluid in the cul-de-sac. A CA-125 was drawn on 06/01/2015 and was normal at 18.9. A CEA was also normal at 1.1.  The patient is otherwise very healthy. She is a stay-at-home mom. Had 2 prior cesarean sections. She's had a tubal ligation versus her only abdominal surgery other than cesarean sections. She does  not desire future fertility.   Current Meds:  Outpatient Encounter Prescriptions as of 06/27/2015  Medication Sig  . [DISCONTINUED] ferrous sulfate 325 (65 FE) MG tablet Take 325 mg by mouth daily with breakfast.     No facility-administered encounter medications on file as of 06/27/2015.    Allergy:  Allergies  Allergen Reactions  . Motrin [Ibuprofen] Itching  . Shellfish Allergy Rash    Social Hx:   Social History   Social History  . Marital Status: Married    Spouse Name: N/A  . Number of Children: N/A  . Years of Education: N/A   Occupational History  . Not on file.   Social History Main Topics  . Smoking status: Never Smoker   . Smokeless tobacco: Not on file  . Alcohol Use: No  . Drug Use: No  . Sexual Activity: Not on file   Other Topics Concern  . Not on file   Social History Narrative    Past Surgical Hx:  Past Surgical History  Procedure Laterality Date  . Cesarean section  07    Past Medical Hx:  Past Medical History  Diagnosis Date  . Diabetes mellitus     during pregnancy  . Anemia     During pregnancy    Past Gynecological History:  C/S x 2  No LMP recorded.  Family Hx: History reviewed. No pertinent family history.  Review of Systems:  Constitutional  Feels well,    ENT Normal appearing ears and nares bilaterally Skin/Breast  No rash, sores, jaundice, itching, dryness Cardiovascular  No chest pain, shortness of breath, or edema  Pulmonary  No cough or wheeze.  Gastro Intestinal  No nausea, vomitting, or diarrhoea. No bright red blood per rectum, no abdominal pain, change in bowel movement, or constipation.  Genito Urinary  No frequency, urgency, dysuria,  Musculo Skeletal  No myalgia, arthralgia, joint swelling or pain  Neurologic  No weakness, numbness, change in gait,  Psychology  No depression, anxiety, insomnia.   Vitals:  Blood pressure 123/84, pulse 74, temperature 98.7 F (37.1 C), temperature source Oral, resp.  rate 18, weight 128 lb (58.06 kg), SpO2 100 %, unknown if currently breastfeeding.  Physical Exam: WD in NAD Neck  Supple NROM, without any enlargements.  Lymph Node Survey No cervical supraclavicular or inguinal adenopathy Cardiovascular  Pulse normal rate, regularity and rhythm. S1 and S2 normal.  Lungs  Clear to auscultation bilateraly, without wheezes/crackles/rhonchi. Good air movement.  Skin  No rash/lesions/breakdown  Psychiatry  Alert and oriented to person, place, and time  Abdomen  Normoactive bowel sounds, abdomen soft, non-tender and thin without evidence of hernia. Cystic, mobile midline mass in lower abdomen/central pelvis. Back No CVA tenderness Genito Urinary  Vulva/vagina: Normal external female genitalia.   No lesions. No discharge or bleeding.  Bladder/urethra:  No lesions or masses, well supported bladder  Vagina: normal  Cervix: Normal appearing, no lesions.  Uterus:  Small, mobile, no parametrial involvement or nodularity.  Adnexa: no masses appreciated vaginally but the abdominal mass is appreciated and moves separate from uterus. Rectal  deferred.  Extremities  No bilateral cyanosis, clubbing or edema.   Donaciano Eva, MD  06/27/2015, 11:43 AM   CC: Dr Viona Gilmore

## 2015-06-27 NOTE — Patient Instructions (Signed)
Preparing for your Surgery  Plan for surgery on July 11 , 2017 with Dr. Dr Everitt Amber  For a Robotic left Salpingo-oophorectomy with possible staging.  Pre-operative Testing -You will receive a phone call from presurgical testing at [facility] to arrange for a pre-operative testing appointment before your surgery.  This appointment normally occurs one to two weeks before your scheduled surgery.   -Bring your insurance card, copy of an advanced directive if applicable, medication list  -At that visit, you will be asked to sign a consent for a possible blood transfusion in case a transfusion becomes necessary during surgery.  The need for a blood transfusion is rare but having consent is a necessary part of your care.     -You should not be taking blood thinners or aspirin at least ten days prior to surgery unless instructed by your surgeon.  Day Before Surgery at [place] will be asked to take in a light diet the day before surgery.  Avoid carbonated beverages.  You will be advised to have nothing to eat or drink after midnight the evening before.     Eat a light diet the day before surgery.  Examples including soups, broths,  toast, yogurt, mashed potatoes.  Things to avoid include carbonated beverages  (fizzy beverages), raw fruits and raw vegetables, or beans.    If your bowels are filled with gas, your surgeon will have difficulty  visualizing your pelvic organs which increases your surgical risks.  Your role in recovery Your role is to become active as soon as directed by your doctor, while still giving yourself time to heal.  Rest when you feel tired. You will be asked to do the following in order to speed your recovery:  - Cough and breathe deeply. This helps toclear and expand your lungs and can prevent pneumonia. You may be given a spirometer to practice deep breathing. A staff member will show you how to use the spirometer. - Do mild physical activity.  Walking or moving your legs help your circulation and body functions return to normal. A staff member will help you when you try to walk and will provide you with simple exercises. Do not try to get up or walk alone the first time. - Actively manage your pain. Managing your pain lets you move in comfort. We will ask you to rate your pain on a scale of zero to 10. It is your responsibility to tell your doctor or nurse where and how much you hurt so your pain can be treated.  Special Considerations -If you are diabetic, you may be placed on insulin after surgery to have closer control over your blood sugars to promote healing and recovery.  This does not mean that you will be discharged on insulin.  If applicable, your oral antidiabetics will be resumed when you are tolerating a solid diet.  -Your final pathology results from surgery should be available by the Friday after surgery and the results will be relayed to you when available.   Blood Transfusion Information WHAT IS A BLOOD TRANSFUSION? A transfusion is the replacement of blood or some of its parts. Blood is made up of multiple cells which provide different functions.  Red blood cells carry oxygen and are used for blood loss replacement.  White blood cells fight against infection.  Platelets control bleeding.  Plasma helps clot blood.  Other blood products are available for specialized needs, such as hemophilia or other clotting disorders. BEFORE THE TRANSFUSION  Who gives blood for transfusions?   You may be able to donate blood to be used at a later date on yourself (autologous donation).  Relatives can be asked to donate blood. This is generally not any safer than if you have received blood from a stranger. The same precautions are taken to ensure safety when a relative's blood is donated.  Healthy volunteers who are fully evaluated to make sure their blood is safe. This is blood bank blood. Transfusion therapy is the safest it  has ever been in the practice of medicine. Before blood is taken from a donor, a complete history is taken to make sure that person has no history of diseases nor engages in risky social behavior (examples are intravenous drug use or sexual activity with multiple partners). The donor's travel history is screened to minimize risk of transmitting infections, such as malaria. The donated blood is tested for signs of infectious diseases, such as HIV and hepatitis. The blood is then tested to be sure it is compatible with you in order to minimize the chance of a transfusion reaction. If you or a relative donates blood, this is often done in anticipation of surgery and is not appropriate for emergency situations. It takes many days to process the donated blood. RISKS AND COMPLICATIONS Although transfusion therapy is very safe and saves many lives, the main dangers of transfusion include:   Getting an infectious disease.  Developing a transfusion reaction. This is an allergic reaction to something in the blood you were given. Every precaution is taken to prevent this. The decision to have a blood transfusion has been considered carefully by your caregiver before blood is given. Blood is not given unless the benefits outweigh the risks.

## 2015-07-08 ENCOUNTER — Encounter (HOSPITAL_COMMUNITY)
Admission: RE | Admit: 2015-07-08 | Discharge: 2015-07-08 | Disposition: A | Discharge status: Home / Self Care | Payer: BLUE CROSS/BLUE SHIELD | Source: Ambulatory Visit | Attending: Gynecologic Oncology | Admitting: Gynecologic Oncology

## 2015-07-08 ENCOUNTER — Encounter (HOSPITAL_COMMUNITY): Payer: Self-pay

## 2015-07-08 DIAGNOSIS — Z0183 Encounter for blood typing: Secondary | ICD-10-CM | POA: Insufficient documentation

## 2015-07-08 DIAGNOSIS — Z01812 Encounter for preprocedural laboratory examination: Secondary | ICD-10-CM | POA: Diagnosis present

## 2015-07-08 DIAGNOSIS — N839 Noninflammatory disorder of ovary, fallopian tube and broad ligament, unspecified: Secondary | ICD-10-CM | POA: Insufficient documentation

## 2015-07-08 HISTORY — DX: Thalassemia, unspecified: D56.9

## 2015-07-08 HISTORY — DX: Anesthesia of skin: R20.0

## 2015-07-08 HISTORY — DX: Other noninflammatory disorders of ovary, fallopian tube and broad ligament: N83.8

## 2015-07-08 HISTORY — DX: Gestational diabetes mellitus in pregnancy, unspecified control: O24.419

## 2015-07-08 LAB — COMPREHENSIVE METABOLIC PANEL
ALBUMIN: 4.4 g/dL (ref 3.5–5.0)
ALT: 21 U/L (ref 14–54)
ANION GAP: 6 (ref 5–15)
AST: 25 U/L (ref 15–41)
Alkaline Phosphatase: 53 U/L (ref 38–126)
BILIRUBIN TOTAL: 0.3 mg/dL (ref 0.3–1.2)
BUN: 15 mg/dL (ref 6–20)
CO2: 30 mmol/L (ref 22–32)
Calcium: 9.3 mg/dL (ref 8.9–10.3)
Chloride: 104 mmol/L (ref 101–111)
Creatinine, Ser: 0.59 mg/dL (ref 0.44–1.00)
GFR calc non Af Amer: >60 mL/min (ref >60)
GLUCOSE: 108 mg/dL — AB (ref 65–99)
POTASSIUM: 4 mmol/L (ref 3.5–5.1)
Sodium: 140 mmol/L (ref 135–145)
TOTAL PROTEIN: 8.4 g/dL — AB (ref 6.5–8.1)

## 2015-07-08 LAB — CBC WITH DIFFERENTIAL/PLATELET
BASOS PCT: 0 %
Basophils Absolute: 0 10*3/uL (ref 0.0–0.1)
EOS ABS: 0.1 10*3/uL (ref 0.0–0.7)
Eosinophils Relative: 1 %
HEMATOCRIT: 39.2 % (ref 36.0–46.0)
Hemoglobin: 13.5 g/dL (ref 12.0–15.0)
LYMPHS PCT: 34 %
Lymphs Abs: 2.8 10*3/uL (ref 0.7–4.0)
MCH: 25.1 pg — ABNORMAL LOW (ref 26.0–34.0)
MCHC: 34.4 g/dL (ref 30.0–36.0)
MCV: 72.9 fL — ABNORMAL LOW (ref 78.0–100.0)
MONO ABS: 0.5 10*3/uL (ref 0.1–1.0)
Monocytes Relative: 6 %
NEUTROS ABS: 4.9 10*3/uL (ref 1.7–7.7)
Neutrophils Relative %: 59 %
Platelets: 293 10*3/uL (ref 150–400)
RBC: 5.38 MIL/uL — ABNORMAL HIGH (ref 3.87–5.11)
RDW: 14.5 % (ref 11.5–15.5)
WBC: 8.3 10*3/uL (ref 4.0–10.5)

## 2015-07-08 LAB — ABO/RH: ABO/RH(D): AB POS

## 2015-07-08 LAB — HCG, SERUM, QUALITATIVE: PREG SERUM: NEGATIVE

## 2015-07-08 NOTE — Patient Instructions (Addendum)
Jill Lee  07/08/2015   Your procedure is scheduled on: Tuesday July 12, 2015  Report to [facility] Main  Entrance take [place]  elevators to 3rd floor to  [place] at 8:15 AM.  Call this number if you have problems the morning of surgery [phone]   Remember: ONLY 1 PERSON MAY GO WITH YOU TO SHORT STAY TO GET  READY MORNING OF Jill Lee.  Do not eat food or drink liquids :After Midnight.     Take these medicines the morning of surgery with A SIP OF WATER: NONE                               You may not have any metal on your body including hair pins and              piercings  Do not wear jewelry, make-up, lotions, powders or perfumes, deodorant             Do not wear nail polish.  Do not shave  48 hours prior to surgery.               Do not bring valuables to the hospital. [name].  Contacts, dentures or bridgework may not be worn into surgery.      Patients discharged the day of surgery will not be allowed to drive home.  Name and phone number of your driver:Jill Lee (husband)              FOLLOW SURGEONS INSTRUCTION IN REGARDS TO BOWEL PREPARATION PRIOR TO SURGICAL PROCEDURE DATE                Please read over the following fact sheets you were given:INCENTIVE SPIROMETER; BLOOD TRANSFUSION INFORMATION SHEET  _____________________________________________________________________             [facility] - Preparing for Surgery Before surgery, you can play an important role.  Because skin is not sterile, your skin needs to be as free of germs as possible.  You can reduce the number of germs on your skin by washing with CHG (chlorahexidine gluconate) soap before surgery.  CHG is an antiseptic cleaner which kills germs and bonds with the skin to continue killing germs even after washing. Please DO NOT use if you have an allergy to CHG or antibacterial soaps.  If your skin  becomes reddened/irritated stop using the CHG and inform your nurse when you arrive at Short Stay. Do not shave (including legs and underarms) for at least 48 hours prior to the first CHG shower.  You may shave your face/neck. Please follow these instructions carefully:  1.  Shower with CHG Soap the night before surgery and the  morning of Surgery.  2.  If you choose to wash your hair, wash your hair first as usual with your  normal  shampoo.  3.  After you shampoo, rinse your hair and body thoroughly to remove the  shampoo.                           4.  Use CHG as you would any other liquid soap.  You can apply chg directly  to the skin and  wash                       Gently with a scrungie or clean washcloth.  5.  Apply the CHG Soap to your body ONLY FROM THE NECK DOWN.   Do not use on face/ open                           Wound or open sores. Avoid contact with eyes, ears mouth and genitals (private parts).                       Wash face,  Genitals (private parts) with your normal soap.             6.  Wash thoroughly, paying special attention to the area where your surgery  will be performed.  7.  Thoroughly rinse your body with warm water from the neck down.  8.  DO NOT shower/wash with your normal soap after using and rinsing off  the CHG Soap.                9.  Pat yourself dry with a clean towel.            10.  Wear clean pajamas.            11.  Place clean sheets on your bed the night of your first shower and do not  sleep with pets. Day of Surgery : Do not apply any lotions/deodorants the morning of surgery.  Please wear clean clothes to the hospital/surgery center.  FAILURE TO FOLLOW THESE INSTRUCTIONS MAY RESULT IN THE CANCELLATION OF YOUR SURGERY PATIENT SIGNATURE_________________________________  NURSE SIGNATURE__________________________________  ________________________________________________________________________   Adam Phenix  An incentive spirometer is a  tool that can help keep your lungs clear and active. This tool measures how well you are filling your lungs with each breath. Taking long deep breaths may help reverse or decrease the chance of developing breathing (pulmonary) problems (especially infection) following:  A long period of time when you are unable to move or be active. BEFORE THE PROCEDURE   If the spirometer includes an indicator to show your best effort, your nurse or respiratory therapist will set it to a desired goal.  If possible, sit up straight or lean slightly forward. Try not to slouch.  Hold the incentive spirometer in an upright position. INSTRUCTIONS FOR USE   Sit on the edge of your bed if possible, or sit up as far as you can in bed or on a chair.  Hold the incentive spirometer in an upright position.  Breathe out normally.  Place the mouthpiece in your mouth and seal your lips tightly around it.  Breathe in slowly and as deeply as possible, raising the piston or the ball toward the top of the column.  Hold your breath for 3-5 seconds or for as long as possible. Allow the piston or ball to fall to the bottom of the column.  Remove the mouthpiece from your mouth and breathe out normally.  Rest for a few seconds and repeat Steps 1 through 7 at least 10 times every 1-2 hours when you are awake. Take your time and take a few normal breaths between deep breaths.  The spirometer may include an indicator to show your best effort. Use the indicator as a goal to work toward during each repetition.  After each set of 10 deep  breaths, practice coughing to be sure your lungs are clear. If you have an incision (the cut made at the time of surgery), support your incision when coughing by placing a pillow or rolled up towels firmly against it. Once you are able to get out of bed, walk around indoors and cough well. You may stop using the incentive spirometer when instructed by your caregiver.  RISKS AND  COMPLICATIONS  Take your time so you do not get dizzy or light-headed.  If you are in pain, you may need to take or ask for pain medication before doing incentive spirometry. It is harder to take a deep breath if you are having pain. AFTER USE  Rest and breathe slowly and easily.  It can be helpful to keep track of a log of your progress. Your caregiver can provide you with a simple table to help with this. If you are using the spirometer at home, follow these instructions: [place] IF:   You are having difficultly using the spirometer.  You have trouble using the spirometer as often as instructed.  Your pain medication is not giving enough relief while using the spirometer.  You develop fever of 100.5 F (38.1 C) or higher. SEEK IMMEDIATE MEDICAL CARE IF:   You cough up bloody sputum that had not been present before.  You develop fever of 102 F (38.9 C) or greater.  You develop worsening pain at or near the incision site. MAKE SURE YOU:   Understand these instructions.  Will watch your condition.  Will get help right away if you are not doing well or get worse. Document Released: 04/30/2006 Document Revised: 03/12/2011 Document Reviewed: 07/01/2006 ExitCare Patient Information 2014 Exit[employer], [place].   ________________________________________________________________________  WHAT IS A BLOOD TRANSFUSION? Blood Transfusion Information  A transfusion is the replacement of blood or some of its parts. Blood is made up of multiple cells which provide different functions.  Red blood cells carry oxygen and are used for blood loss replacement.  White blood cells fight against infection.  Platelets control bleeding.  Plasma helps clot blood.  Other blood products are available for specialized needs, such as hemophilia or other clotting disorders. BEFORE THE TRANSFUSION  Who gives blood for transfusions?   Healthy volunteers who are fully evaluated to make sure  their blood is safe. This is blood bank blood. Transfusion therapy is the safest it has ever been in the practice of medicine. Before blood is taken from a donor, a complete history is taken to make sure that person has no history of diseases nor engages in risky social behavior (examples are intravenous drug use or sexual activity with multiple partners). The donor's travel history is screened to minimize risk of transmitting infections, such as malaria. The donated blood is tested for signs of infectious diseases, such as HIV and hepatitis. The blood is then tested to be sure it is compatible with you in order to minimize the chance of a transfusion reaction. If you or a relative donates blood, this is often done in anticipation of surgery and is not appropriate for emergency situations. It takes many days to process the donated blood. RISKS AND COMPLICATIONS Although transfusion therapy is very safe and saves many lives, the main dangers of transfusion include:   Getting an infectious disease.  Developing a transfusion reaction. This is an allergic reaction to something in the blood you were given. Every precaution is taken to prevent this. The decision to have a blood transfusion has  been considered carefully by your caregiver before blood is given. Blood is not given unless the benefits outweigh the risks. AFTER THE TRANSFUSION  Right after receiving a blood transfusion, you will usually feel much better and more energetic. This is especially true if your red blood cells have gotten low (anemic). The transfusion raises the level of the red blood cells which carry oxygen, and this usually causes an energy increase.  The nurse administering the transfusion will monitor you carefully for complications. HOME CARE INSTRUCTIONS  No special instructions are needed after a transfusion. You may find your energy is better. Speak with your caregiver about any limitations on activity for underlying diseases  you may have. SEEK MEDICAL CARE IF:   Your condition is not improving after your transfusion.  You develop redness or irritation at the intravenous (IV) site. SEEK IMMEDIATE MEDICAL CARE IF:  Any of the following symptoms occur over the next 12 hours:  Shaking chills.  You have a temperature by mouth above 102 F (38.9 C), not controlled by medicine.  Chest, back, or muscle pain.  People around you feel you are not acting correctly or are confused.  Shortness of breath or difficulty breathing.  Dizziness and fainting.  You get a rash or develop hives.  You have a decrease in urine output.  Your urine turns a dark color or changes to pink, red, or brown. Any of the following symptoms occur over the next 10 days:  You have a temperature by mouth above 102 F (38.9 C), not controlled by medicine.  Shortness of breath.  Weakness after normal activity.  The white part of the eye turns yellow (jaundice).  You have a decrease in the amount of urine or are urinating less often.  Your urine turns a dark color or changes to pink, red, or brown. Document Released: 12/16/1999 Document Revised: 03/12/2011 Document Reviewed: 08/04/2007 [facility] Patient Information 2014 [place], [place].  _______________________________________________________________________

## 2015-07-12 ENCOUNTER — Encounter (HOSPITAL_COMMUNITY)
Admission: RE | Disposition: A | Discharge status: Home / Self Care | Payer: Self-pay | Source: Ambulatory Visit | Attending: Gynecologic Oncology

## 2015-07-12 ENCOUNTER — Ambulatory Visit (HOSPITAL_COMMUNITY): Payer: BLUE CROSS/BLUE SHIELD | Admitting: Anesthesiology

## 2015-07-12 ENCOUNTER — Encounter (HOSPITAL_COMMUNITY): Payer: Self-pay | Admitting: *Deleted

## 2015-07-12 ENCOUNTER — Ambulatory Visit (HOSPITAL_COMMUNITY)
Admission: RE | Admit: 2015-07-12 | Discharge: 2015-07-12 | Disposition: A | Discharge status: Home / Self Care | Payer: BLUE CROSS/BLUE SHIELD | Source: Ambulatory Visit | Attending: Gynecologic Oncology | Admitting: Gynecologic Oncology

## 2015-07-12 DIAGNOSIS — N838 Other noninflammatory disorders of ovary, fallopian tube and broad ligament: Secondary | ICD-10-CM | POA: Diagnosis present

## 2015-07-12 DIAGNOSIS — D271 Benign neoplasm of left ovary: Secondary | ICD-10-CM | POA: Diagnosis not present

## 2015-07-12 DIAGNOSIS — N83202 Unspecified ovarian cyst, left side: Secondary | ICD-10-CM | POA: Diagnosis not present

## 2015-07-12 HISTORY — PX: ROBOTIC ASSISTED SALPINGO OOPHERECTOMY: SHX6082

## 2015-07-12 LAB — TYPE AND SCREEN
ABO/RH(D): AB POS
ANTIBODY SCREEN: NEGATIVE

## 2015-07-12 SURGERY — SALPINGO-OOPHORECTOMY, ROBOT-ASSISTED
Anesthesia: General | Laterality: Left

## 2015-07-12 MED ORDER — SUGAMMADEX SODIUM 200 MG/2ML IV SOLN
INTRAVENOUS | Status: DC | PRN
  Administered 2015-07-12: 200 mg via INTRAVENOUS

## 2015-07-12 MED ORDER — MIDAZOLAM HCL 2 MG/2ML IJ SOLN
INTRAMUSCULAR | Status: AC
  Filled 2015-07-12: qty 2

## 2015-07-12 MED ORDER — SUGAMMADEX SODIUM 200 MG/2ML IV SOLN
INTRAVENOUS | Status: AC
  Filled 2015-07-12: qty 2

## 2015-07-12 MED ORDER — OXYCODONE-ACETAMINOPHEN 10-325 MG PO TABS: 1.0000 | ORAL_TABLET | ORAL | Status: AC | PRN

## 2015-07-12 MED ORDER — MORPHINE SULFATE (PF) 10 MG/ML IV SOLN: 2.0000 mg | INTRAVENOUS | Status: DC | PRN

## 2015-07-12 MED ORDER — ONDANSETRON HCL 4 MG/2ML IJ SOLN
INTRAMUSCULAR | Status: DC | PRN
  Administered 2015-07-12: 4 mg via INTRAVENOUS

## 2015-07-12 MED ORDER — ACETAMINOPHEN 650 MG RE SUPP
650.0000 mg | RECTAL | Status: DC | PRN
  Filled 2015-07-12: qty 1

## 2015-07-12 MED ORDER — ONDANSETRON HCL 4 MG/2ML IJ SOLN: 4.0000 mg | Freq: Once | INTRAMUSCULAR | Status: DC | PRN

## 2015-07-12 MED ORDER — HYDROMORPHONE HCL 1 MG/ML IJ SOLN
0.2500 mg | INTRAMUSCULAR | Status: DC | PRN
Start: 2015-07-12 — End: 2015-07-12
  Administered 2015-07-12: 0.5 mg via INTRAVENOUS
  Administered 2015-07-12 (×2): 0.25 mg via INTRAVENOUS

## 2015-07-12 MED ORDER — PROPOFOL 10 MG/ML IV BOLUS
INTRAVENOUS | Status: AC
  Filled 2015-07-12: qty 20

## 2015-07-12 MED ORDER — ROCURONIUM BROMIDE 100 MG/10ML IV SOLN
INTRAVENOUS | Status: AC
  Filled 2015-07-12: qty 1

## 2015-07-12 MED ORDER — CEFAZOLIN SODIUM-DEXTROSE 2-4 GM/100ML-% IV SOLN
2.0000 g | INTRAVENOUS | Status: AC
  Administered 2015-07-12: 2 g via INTRAVENOUS
  Filled 2015-07-12: qty 100

## 2015-07-12 MED ORDER — BUPIVACAINE HCL 0.25 % IJ SOLN
INTRAMUSCULAR | Status: DC | PRN
  Administered 2015-07-12: 17 mL

## 2015-07-12 MED ORDER — FENTANYL CITRATE (PF) 100 MCG/2ML IJ SOLN
INTRAMUSCULAR | Status: DC | PRN
  Administered 2015-07-12: 25 ug via INTRAVENOUS
  Administered 2015-07-12: 50 ug via INTRAVENOUS
  Administered 2015-07-12: 25 ug via INTRAVENOUS
  Administered 2015-07-12: 50 ug via INTRAVENOUS

## 2015-07-12 MED ORDER — SODIUM CHLORIDE 0.9% FLUSH: 3.0000 mL | INTRAVENOUS | Status: DC | PRN

## 2015-07-12 MED ORDER — LACTATED RINGERS IV SOLN
INTRAVENOUS | Status: DC | PRN
  Administered 2015-07-12: 1000 mL

## 2015-07-12 MED ORDER — BUPIVACAINE HCL (PF) 0.25 % IJ SOLN
INTRAMUSCULAR | Status: AC
  Filled 2015-07-12: qty 30

## 2015-07-12 MED ORDER — HYDROMORPHONE HCL 1 MG/ML IJ SOLN
INTRAMUSCULAR | Status: AC
  Filled 2015-07-12: qty 1

## 2015-07-12 MED ORDER — SODIUM CHLORIDE 0.9 % IV SOLN: 250.0000 mL | INTRAVENOUS | Status: DC | PRN

## 2015-07-12 MED ORDER — DEXAMETHASONE SODIUM PHOSPHATE 10 MG/ML IJ SOLN
INTRAMUSCULAR | Status: DC | PRN
  Administered 2015-07-12: 10 mg via INTRAVENOUS

## 2015-07-12 MED ORDER — ACETAMINOPHEN 500 MG PO TABS
1000.0000 mg | ORAL_TABLET | Freq: Four times a day (QID) | ORAL | Status: DC
  Administered 2015-07-12: 1000 mg via ORAL
  Filled 2015-07-12 (×5): qty 2

## 2015-07-12 MED ORDER — ROCURONIUM BROMIDE 100 MG/10ML IV SOLN
INTRAVENOUS | Status: DC | PRN
  Administered 2015-07-12: 40 mg via INTRAVENOUS

## 2015-07-12 MED ORDER — CEFAZOLIN SODIUM-DEXTROSE 2-4 GM/100ML-% IV SOLN
INTRAVENOUS | Status: AC
  Filled 2015-07-12: qty 100

## 2015-07-12 MED ORDER — LIDOCAINE HCL (CARDIAC) 20 MG/ML IV SOLN
INTRAVENOUS | Status: DC | PRN
  Administered 2015-07-12: 50 mg via INTRAVENOUS

## 2015-07-12 MED ORDER — FENTANYL CITRATE (PF) 250 MCG/5ML IJ SOLN
INTRAMUSCULAR | Status: AC
  Filled 2015-07-12: qty 5

## 2015-07-12 MED ORDER — SODIUM CHLORIDE 0.9% FLUSH: 3.0000 mL | Freq: Two times a day (BID) | INTRAVENOUS | Status: DC

## 2015-07-12 MED ORDER — OXYCODONE HCL 5 MG PO TABS: 5.0000 mg | ORAL_TABLET | ORAL | Status: DC | PRN

## 2015-07-12 MED ORDER — ACETAMINOPHEN 325 MG PO TABS: 650.0000 mg | ORAL_TABLET | ORAL | Status: DC | PRN

## 2015-07-12 MED ORDER — STERILE WATER FOR IRRIGATION IR SOLN
Status: DC | PRN
  Administered 2015-07-12: 1000 mL

## 2015-07-12 MED ORDER — MIDAZOLAM HCL 5 MG/5ML IJ SOLN
INTRAMUSCULAR | Status: DC | PRN
  Administered 2015-07-12: 2 mg via INTRAVENOUS

## 2015-07-12 MED ORDER — LACTATED RINGERS IV SOLN
INTRAVENOUS | Status: DC | PRN
  Administered 2015-07-12 (×2): via INTRAVENOUS

## 2015-07-12 MED ORDER — MEPERIDINE HCL 50 MG/ML IJ SOLN: 6.2500 mg | INTRAMUSCULAR | Status: DC | PRN

## 2015-07-12 MED ORDER — ONDANSETRON HCL 4 MG/2ML IJ SOLN
INTRAMUSCULAR | Status: AC
  Filled 2015-07-12: qty 2

## 2015-07-12 MED ORDER — LIDOCAINE HCL (CARDIAC) 20 MG/ML IV SOLN
INTRAVENOUS | Status: AC
  Filled 2015-07-12: qty 5

## 2015-07-12 MED ORDER — PROPOFOL 10 MG/ML IV BOLUS
INTRAVENOUS | Status: DC | PRN
Start: 2015-07-12 — End: 2015-07-12
  Administered 2015-07-12: 110 mg via INTRAVENOUS

## 2015-07-12 SURGICAL SUPPLY — 46 items
CHLORAPREP W/TINT 26ML (MISCELLANEOUS) ×2 IMPLANT
COVER SURGICAL LIGHT HANDLE (MISCELLANEOUS) ×2 IMPLANT
COVER TIP SHEARS 8 DVNC (MISCELLANEOUS) ×1 IMPLANT
COVER TIP SHEARS 8MM DA VINCI (MISCELLANEOUS) ×1
DRAPE ARM DVNC X/XI (DISPOSABLE) ×4 IMPLANT
DRAPE COLUMN DVNC XI (DISPOSABLE) ×1 IMPLANT
DRAPE DA VINCI XI ARM (DISPOSABLE) ×4
DRAPE DA VINCI XI COLUMN (DISPOSABLE) ×1
DRAPE SHEET LG 3/4 BI-LAMINATE (DRAPES) ×4 IMPLANT
DRAPE SURG IRRIG POUCH 19X23 (DRAPES) ×2 IMPLANT
ELECT REM PT RETURN 9FT ADLT (ELECTROSURGICAL) ×2
ELECTRODE REM PT RTRN 9FT ADLT (ELECTROSURGICAL) ×1 IMPLANT
GLOVE BIO SURGEON STRL SZ 6 (GLOVE) ×8 IMPLANT
GLOVE BIO SURGEON STRL SZ 6.5 (GLOVE) ×4 IMPLANT
GOWN STRL REUS W/ TWL LRG LVL3 (GOWN DISPOSABLE) ×3 IMPLANT
GOWN STRL REUS W/TWL LRG LVL3 (GOWN DISPOSABLE) ×3
HOLDER FOLEY CATH W/STRAP (MISCELLANEOUS) ×2 IMPLANT
KIT BASIN OR (CUSTOM PROCEDURE TRAY) ×2 IMPLANT
LIQUID BAND (GAUZE/BANDAGES/DRESSINGS) ×2 IMPLANT
MANIPULATOR UTERINE 4.5 ZUMI (MISCELLANEOUS) IMPLANT
MARKER SKIN DUAL TIP RULER LAB (MISCELLANEOUS) ×2 IMPLANT
OBTURATOR XI 8MM BLADELESS (TROCAR) ×2 IMPLANT
OCCLUDER COLPOPNEUMO (BALLOONS) IMPLANT
PAD POSITIONING PINK XL (MISCELLANEOUS) ×2 IMPLANT
PORT ACCESS TROCAR AIRSEAL 12 (TROCAR) ×1 IMPLANT
PORT ACCESS TROCAR AIRSEAL 5M (TROCAR) ×1
POUCH ENDO CATCH II 15MM (MISCELLANEOUS) IMPLANT
POUCH SPECIMEN RETRIEVAL 10MM (ENDOMECHANICALS) IMPLANT
SEAL CANN UNIV 5-8 DVNC XI (MISCELLANEOUS) ×4 IMPLANT
SEAL XI 5MM-8MM UNIVERSAL (MISCELLANEOUS) ×4
SET TRI-LUMEN FLTR TB AIRSEAL (TUBING) IMPLANT
SET TUBE IRRIG SUCTION NO TIP (IRRIGATION / IRRIGATOR) ×2 IMPLANT
SHEET LAVH (DRAPES) ×2 IMPLANT
SOLUTION ELECTROLUBE (MISCELLANEOUS) ×2 IMPLANT
SUT MNCRL AB 4-0 PS2 18 (SUTURE) ×4 IMPLANT
SUT VIC AB 0 CT1 27 (SUTURE)
SUT VIC AB 0 CT1 27XBRD ANTBC (SUTURE) IMPLANT
SYR 50ML LL SCALE MARK (SYRINGE) IMPLANT
TOWEL OR 17X26 10 PK STRL BLUE (TOWEL DISPOSABLE) ×4 IMPLANT
TOWEL OR NON WOVEN STRL DISP B (DISPOSABLE) ×2 IMPLANT
TRAP SPECIMEN MUCOUS 40CC (MISCELLANEOUS) IMPLANT
TRAY FOLEY W/METER SILVER 14FR (SET/KITS/TRAYS/PACK) ×2 IMPLANT
TRAY LAPAROSCOPIC (CUSTOM PROCEDURE TRAY) ×2 IMPLANT
TROCAR BLADELESS OPT 5 100 (ENDOMECHANICALS) ×2 IMPLANT
UNDERPAD 30X30 INCONTINENT (UNDERPADS AND DIAPERS) ×2 IMPLANT
WATER STERILE IRR 1500ML POUR (IV SOLUTION) ×2 IMPLANT

## 2015-07-12 NOTE — Discharge Instructions (Signed)
Unilateral Salpingo-Oophorectomy, Care After Refer to this sheet in the next few weeks. These instructions provide you with information on caring for yourself after your procedure. Your health care provider may also give you more specific instructions. Your treatment has been planned according to current medical practices, but problems sometimes occur. Call your health care provider if you have any problems or questions after your procedure. WHAT TO EXPECT AFTER THE PROCEDURE After your procedure, it is typical to have the following:  Abdominal pain that can be controlled with pain medicine.  Vaginal spotting.  Constipation. HOME CARE INSTRUCTIONS   Get plenty of rest and sleep.  Only take over-the-counter or prescription medicines as directed by your health care provider. Do not take aspirin. It can cause bleeding.  Keep incision areas clean and dry. Remove or change any bandages (dressings) only as directed by your health care provider.  Follow your health care provider's advice regarding diet.  Drink enough fluids to keep your urine clear or pale yellow.  Limit exercise and activities as directed by your health care provider. Do not lift anything heavier than 5 pounds (2.3 kg) until your health care provider approves.  Do not drive until your health care provider approves.  Do not drink alcohol until your health care provider approves.  Do not have sexual intercourse until your health care provider says it is OK.  Take your temperature twice a day and write it down.  If you become constipated, you may:  Ask your health care provider about taking a mild laxative.  Add more fruit and bran to your diet.  Drink more fluids.  Follow up with your health care provider as directed. SEEK MEDICAL CARE IF:   You have swelling or redness in the incision area.  You develop a rash.  You feel lightheaded.  You have pain that is not controlled with medicine.  You have pain,  swelling, or redness where the IV access tube was placed. SEEK IMMEDIATE MEDICAL CARE IF:  You have a fever.  You develop increasing abdominal pain.  You see pus coming out of the incision, or the incision is separating.  You notice a bad smell coming from the wound or dressing.  You have excessive vaginal bleeding.  You feel sick to your stomach (nauseous) and vomit.  You have leg or chest pain.  You have pain when you urinate.  You develop shortness of breath.  You pass out.   This information is not intended to replace advice given to you by your health care provider. Make sure you discuss any questions you have with your health care provider.   Document Released: 10/14/2008 Document Revised: 10/08/2012 Document Reviewed: 06/11/2012 Elsevier Interactive Patient Education 2016 Dry Prong Anesthesia, Adult, Care After Refer to this sheet in the next few weeks. These instructions provide you with information on caring for yourself after your procedure. Your health care provider may also give you more specific instructions. Your treatment has been planned according to current medical practices, but problems sometimes occur. Call your health care provider if you have any problems or questions after your procedure. WHAT TO EXPECT AFTER THE PROCEDURE After the procedure, it is typical to experience:  Sleepiness.  Nausea and vomiting. HOME CARE INSTRUCTIONS  For the first 24 hours after general anesthesia:  Have a responsible person with you.  Do not drive a car. If you are alone, do not take public transportation.  Do not drink alcohol.  Do not take medicine that  has not been prescribed by your health care provider.  Do not sign important papers or make important decisions.  You may resume a normal diet and activities as directed by your health care provider.  Change bandages (dressings) as directed.  If you have questions or problems that seem related to  general anesthesia, call the hospital and ask for the anesthetist or anesthesiologist on call. SEEK MEDICAL CARE IF:  You have nausea and vomiting that continue the day after anesthesia.  You develop a rash. SEEK IMMEDIATE MEDICAL CARE IF:   You have difficulty breathing.  You have chest pain.  You have any allergic problems.   This information is not intended to replace advice given to you by your health care provider. Make sure you discuss any questions you have with your health care provider.   Document Released: 03/26/2000 Document Revised: 01/08/2014 Document Reviewed: 04/18/2011 Elsevier Interactive Patient Education [employer].

## 2015-07-12 NOTE — H&P (View-Only) (Signed)
Consult Note: Gyn-Onc  Consult was requested by Dr. Nyoka Cowden for the evaluation of Jill Lee 45 y.o. female  CC:  Chief Complaint  Patient presents with  . Ovarian Mass    New patient    Assessment/Plan:  Ms. Jill Lee  is a 45 y.o.  year old with a large left ovarian cystic mass and normal CA 125.  I have a low suspicion for malignancy given its largely uniform appearance, and large size and normal tumor marker. However, given its size I recommend removal with frozen section and staging (including hysterectomy) if malignancy is identified.  The patient has completed child bearing and is s/p tubal ligation. She does not desire future fertility.  I discussed operative risks including  bleeding, infection, damage to internal organs (such as bladder,ureters, bowels), blood clot, reoperation and rehospitalization.  Surgery is scheduled for 07/12/15.  HPI: Jill Lee ("Jill Lee") is a 45 year old G2P2 [place] who is seen in consultation at the request of Dr Grayland Ormond in [place] for a left ovarian mass. The patient reports noticing a protrusion or mass in her lower abdomen in May 2017. This prompted her to be evaluated by Dr. Viona Gilmore who performed a transvaginal pelvic ultrasound scan on 05/25/2015. It showed a grossly normal uterus measuring 8.9 x 3.9 x 4.9 cm, and a right ovary measuring 2.9 x 2.8 x 1.8 cm which was normal in appearance. The left ovary contained a complex 13 x 11.2 x 12.5 cm adnexal cystic mass with eccentric 4.2 x 3.0 x 4.4 cm daughter cyst with thick irregular wall and questionable internal vascularity on Doppler. There was no free fluid in the cul-de-sac. A CA-125 was drawn on 06/01/2015 and was normal at 18.9. A CEA was also normal at 1.1.  The patient is otherwise very healthy. She is a stay-at-home mom. Had 2 prior cesarean sections. She's had a tubal ligation versus her only abdominal surgery other than cesarean sections. She does  not desire future fertility.   Current Meds:  Outpatient Encounter Prescriptions as of 06/27/2015  Medication Sig  . [DISCONTINUED] ferrous sulfate 325 (65 FE) MG tablet Take 325 mg by mouth daily with breakfast.     No facility-administered encounter medications on file as of 06/27/2015.    Allergy:  Allergies  Allergen Reactions  . Motrin [Ibuprofen] Itching  . Shellfish Allergy Rash    Social Hx:   Social History   Social History  . Marital Status: Married    Spouse Name: N/A  . Number of Children: N/A  . Years of Education: N/A   Occupational History  . Not on file.   Social History Main Topics  . Smoking status: Never Smoker   . Smokeless tobacco: Not on file  . Alcohol Use: No  . Drug Use: No  . Sexual Activity: Not on file   Other Topics Concern  . Not on file   Social History Narrative    Past Surgical Hx:  Past Surgical History  Procedure Laterality Date  . Cesarean section  07    Past Medical Hx:  Past Medical History  Diagnosis Date  . Diabetes mellitus     during pregnancy  . Anemia     During pregnancy    Past Gynecological History:  C/S x 2  No LMP recorded.  Family Hx: History reviewed. No pertinent family history.  Review of Systems:  Constitutional  Feels well,    ENT Normal appearing ears and nares bilaterally Skin/Breast  No rash, sores, jaundice, itching, dryness Cardiovascular  No chest pain, shortness of breath, or edema  Pulmonary  No cough or wheeze.  Gastro Intestinal  No nausea, vomitting, or diarrhoea. No bright red blood per rectum, no abdominal pain, change in bowel movement, or constipation.  Genito Urinary  No frequency, urgency, dysuria,  Musculo Skeletal  No myalgia, arthralgia, joint swelling or pain  Neurologic  No weakness, numbness, change in gait,  Psychology  No depression, anxiety, insomnia.   Vitals:  Blood pressure 123/84, pulse 74, temperature 98.7 F (37.1 C), temperature source Oral, resp.  rate 18, weight 128 lb (58.06 kg), SpO2 100 %, unknown if currently breastfeeding.  Physical Exam: WD in NAD Neck  Supple NROM, without any enlargements.  Lymph Node Survey No cervical supraclavicular or inguinal adenopathy Cardiovascular  Pulse normal rate, regularity and rhythm. S1 and S2 normal.  Lungs  Clear to auscultation bilateraly, without wheezes/crackles/rhonchi. Good air movement.  Skin  No rash/lesions/breakdown  Psychiatry  Alert and oriented to person, place, and time  Abdomen  Normoactive bowel sounds, abdomen soft, non-tender and thin without evidence of hernia. Cystic, mobile midline mass in lower abdomen/central pelvis. Back No CVA tenderness Genito Urinary  Vulva/vagina: Normal external female genitalia.   No lesions. No discharge or bleeding.  Bladder/urethra:  No lesions or masses, well supported bladder  Vagina: normal  Cervix: Normal appearing, no lesions.  Uterus:  Small, mobile, no parametrial involvement or nodularity.  Adnexa: no masses appreciated vaginally but the abdominal mass is appreciated and moves separate from uterus. Rectal  deferred.  Extremities  No bilateral cyanosis, clubbing or edema.   Donaciano Eva, MD  06/27/2015, 11:43 AM   CC: Dr Viona Gilmore

## 2015-07-12 NOTE — Anesthesia Preprocedure Evaluation (Addendum)
Anesthesia Evaluation  Patient identified by MRN, date of birth, ID band Patient awake    Reviewed: Allergy & Precautions, NPO status , Patient's Chart, lab work & pertinent test results  Airway Mallampati: I  TM Distance: >3 FB Neck ROM: Full    Dental   Pulmonary    Pulmonary exam normal        Cardiovascular Normal cardiovascular exam     Neuro/Psych    GI/Hepatic   Endo/Other  diabetes  Renal/GU      Musculoskeletal   Abdominal   Peds  Hematology   Anesthesia Other Findings   Reproductive/Obstetrics                           Anesthesia Physical Anesthesia Plan  ASA: II  Anesthesia Plan: General   Post-op Pain Management:    Induction: Intravenous  Airway Management Planned: Oral ETT  Additional Equipment:   Intra-op Plan:   Post-operative Plan: Extubation in OR  Informed Consent: I have reviewed the patients History and Physical, chart, labs and discussed the procedure including the risks, benefits and alternatives for the proposed anesthesia with the patient or authorized representative who has indicated his/her understanding and acceptance.     Plan Discussed with: CRNA and Surgeon  Anesthesia Plan Comments:         Anesthesia Quick Evaluation

## 2015-07-12 NOTE — Op Note (Signed)
OPERATIVE NOTE  Date: 07/12/15  Preoperative Diagnosis: left ovarian cyst   Postoperative Diagnosis:  same  Procedure(s) Performed: Robotic-assisted left salpingo-oophorectomy  Surgeon: Everitt Amber, M.D.  Assistant Surgeon: Lahoma Crocker M.D. (an MD assistant was necessary for tissue manipulation, management of robotic instrumentation, retraction and positioning due to the complexity of the case and hospital policies).   Anesthesia: Gen. endotracheal.  Specimens: Bilateral ovaries, fallopian tubes, pelvic washings  Estimated Blood Loss: <50 mL. 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 She is placed in a dorsolithotomy position and cervical acromial pad was placed. The arms were tucked with care taken to pad the olecranon process. And prepped and draped in usual sterile fashion. A uterine manipulator (zumi) was placed vaginally. A 35mm incision was made in the left upper quadrant palmer's point and a 5 mm Optiview trocar used to enter the abdomen under direct visualization. With entry into the abdomen and then maintenance of 15 mm of mercury the patient was placed in Trendelenburg position. An incision was made 1 handspan above the umbilicus and a 1mm trochar was placed through this site. Two incisions were made lateral to the umbilical incision in the left and right abdomen measuring 33mm. These incisions were made approximately 8 cm lateral to the midline incision. 8 mm robotic trochars were inserted. The robot was docked.  The ovary was deflected into the mid abdomen to expose the left pelvic sidewall. The sigmoid colon was skeletonized off of the left ovarian vessels. The left peritoneum and  the side wall was incised, and the retroperitoneal space entered. The left ureter was identified and the left pararectal space was developed. The utero-ovarian ligament was skeletonized cauterized and transected. The left utero-ovarian ligaments were cauterized and transected in the left adnexa was placed in a 15cm Endo Catch bag.  The abdomen was copiously irrigated and drained and all operative sites inspected and hemostasis was assured.  The robot was undocked. The camera was placed through the left upper abdominal incision. The contents of the Endo Catch bag were first aspirated and then morcellated to facilitate removal from the abdominal cavity through the left upper quadrant incision. Frozen section revealed benign cyst.   The ports were all remove. The fascial closure at the umbilical incision and left upper quadrant port was made with 0 Vicryl.  All incisions were closed with a running subcuticular Monocryl suture. Dermabond was applied. Quarter percent marcaine was infiltrated into the incisions.  Pressure dressings were applied as there was additional oozing from the incisions noted.  Sponge, lap and needle counts were correct x 3.    The patient had sequential compression devices for VTE prophylaxis.         Disposition: PACU -stable         Condition: stable  Donaciano Eva, MD

## 2015-07-12 NOTE — Transfer of Care (Signed)
Immediate Anesthesia Transfer of Care Note  Patient: Jill Lee  Procedure(s) Performed: Procedure(s): XI ROBOTIC ASSISTED LAPAROSCOPIC LEFT SALPINGO OOPHORECTOMY,  (Left)  Patient Location: PACU  Anesthesia Type:General  Level of Consciousness: awake, alert  and oriented  Airway & Oxygen Therapy: Patient Spontanous Breathing and Patient connected to face mask oxygen  Post-op Assessment: Report given to RN and Post -op Vital signs reviewed and stable  Post vital signs: Reviewed and stable  Last Vitals:  Filed Vitals:   07/12/15 0754  BP: 125/83  Pulse: 72  Temp: 37.2 C  Resp: 16    Last Pain: There were no vitals filed for this visit.       Complications: No apparent anesthesia complications

## 2015-07-12 NOTE — Anesthesia Postprocedure Evaluation (Signed)
Anesthesia Post Note  Patient: Jill Lee  Procedure(s) Performed: Procedure(s) (LRB): XI ROBOTIC ASSISTED LAPAROSCOPIC LEFT SALPINGO OOPHORECTOMY,  (Left)  Patient location during evaluation: PACU Anesthesia Type: General Level of consciousness: awake and alert and oriented Pain management: pain level controlled Vital Signs Assessment: post-procedure vital signs reviewed and stable Respiratory status: spontaneous breathing, nonlabored ventilation and respiratory function stable Cardiovascular status: blood pressure returned to baseline and stable Postop Assessment: no signs of nausea or vomiting Anesthetic complications: no    Last Vitals:  Filed Vitals:   07/12/15 1318 07/12/15 1332  BP:  108/68  Pulse: 65 69  Temp:  37 C  Resp: 18 14    Last Pain:  Filed Vitals:   07/12/15 1455  PainSc: 5                  Prestin Munch A.

## 2015-07-12 NOTE — Anesthesia Procedure Notes (Signed)
Procedure Name: Intubation Date/Time: 07/12/2015 10:18 AM Performed by: Glory Buff Pre-anesthesia Checklist: Patient identified, Emergency Drugs available, Suction available and Patient being monitored Patient Re-evaluated:Patient Re-evaluated prior to inductionOxygen Delivery Method: Circle system utilized Preoxygenation: Pre-oxygenation with 100% oxygen Intubation Type: IV induction Ventilation: Mask ventilation without difficulty Laryngoscope Size: Miller and 3 (DL x 1 by "Caryl Pina" EMT student without success., DL x 1 with Sabra Heck 3 by GS.) Grade View: Grade II Tube type: Oral Number of attempts: 3 Airway Equipment and Method: Stylet and Oral airway Placement Confirmation: ETT inserted through vocal cords under direct vision,  positive ETCO2 and breath sounds checked- equal and bilateral Secured at: 19 cm Tube secured with: Tape Dental Injury: Teeth and Oropharynx as per pre-operative assessment

## 2015-07-12 NOTE — Progress Notes (Signed)
  July 12, 2015  Patient: [name]  Date of Birth: Jul 26, 1970  Date of Visit: 07/12/2015    To Whom It May Concern:  Jill Lee was seen and treated in our [place] on 07/12/15. Please excuse [name] from work.  Sincerely,   Gaynelle Cage RN [phone]

## 2015-07-12 NOTE — Interval H&P Note (Signed)
History and Physical Interval Note:  07/12/2015 9:33 AM  Jill Lee  has presented today for surgery, with the diagnosis of OVARIAN MASS  The various methods of treatment have been discussed with the patient and family. After consideration of risks, benefits and other options for treatment, the patient has consented to  Procedure(s): XI ROBOTIC ASSISTED LAPAROSCOPIC LEFT SALPINGO OOPHORECTOMY, POSSIBLE STAGGING (Left) as a surgical intervention .  The patient's history has been reviewed, patient examined, no change in status, stable for surgery.  I have reviewed the patient's chart and labs.  Questions were answered to the patient's satisfaction.     Donaciano Eva

## 2015-07-12 NOTE — Anesthesia Postprocedure Evaluation (Signed)
Anesthesia Post Note  Patient: Jill Lee  Procedure(s) Performed: Procedure(s) (LRB): XI ROBOTIC ASSISTED LAPAROSCOPIC LEFT SALPINGO OOPHORECTOMY,  (Left)  Patient location during evaluation: PACU Anesthesia Type: General Level of consciousness: awake and alert Pain management: pain level controlled Vital Signs Assessment: post-procedure vital signs reviewed and stable Respiratory status: spontaneous breathing, nonlabored ventilation, respiratory function stable and patient connected to nasal cannula oxygen Cardiovascular status: blood pressure returned to baseline and stable Postop Assessment: no signs of nausea or vomiting Anesthetic complications: no    Last Vitals:  Filed Vitals:   07/12/15 1332 07/12/15 1531  BP: 108/68 110/73  Pulse: 69 74  Temp: 37 C 36.6 C  Resp: 14 16    Last Pain:  Filed Vitals:   07/12/15 1532  PainSc: 1                  Demontae Antunes DAVID

## 2015-08-17 ENCOUNTER — Encounter: Payer: Self-pay | Admitting: Gynecologic Oncology

## 2015-08-17 ENCOUNTER — Ambulatory Visit: Payer: BLUE CROSS/BLUE SHIELD | Attending: Gynecologic Oncology | Admitting: Gynecologic Oncology

## 2015-08-17 VITALS — BP 116/72 | HR 74 | Temp 97.8°F | Resp 18 | Ht 61.0 in | Wt 124.3 lb

## 2015-08-17 DIAGNOSIS — D271 Benign neoplasm of left ovary: Secondary | ICD-10-CM | POA: Insufficient documentation

## 2015-08-17 DIAGNOSIS — Z90721 Acquired absence of ovaries, unilateral: Secondary | ICD-10-CM | POA: Diagnosis not present

## 2015-08-17 DIAGNOSIS — N839 Noninflammatory disorder of ovary, fallopian tube and broad ligament, unspecified: Secondary | ICD-10-CM | POA: Diagnosis present

## 2015-08-17 DIAGNOSIS — Z9889 Other specified postprocedural states: Secondary | ICD-10-CM | POA: Insufficient documentation

## 2015-08-17 DIAGNOSIS — N838 Other noninflammatory disorders of ovary, fallopian tube and broad ligament: Secondary | ICD-10-CM

## 2015-08-17 NOTE — Patient Instructions (Signed)
No follow necessary with Dr. Denman George.  Please call for any questions or concerns.

## 2015-08-17 NOTE — Progress Notes (Signed)
POSTOPERATIVE FOLLOW-UP  HPI:  Jill Lee is a 45 y.o. year old G2P1002 initially seen in consultation referred by Dr Nyoka Cowden for a 15cm left ovarian mass.  She then underwent a robotic assisted left salpingo-oophorectomy on 0000000 without complications.  Her postoperative course was uncomplicated.  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  She is seen today for a postoperative check and to discuss her pathology results and ongoing plan.  Since discharge from the hospital, she is feeling well.  She has improving appetite, normal bowel and bladder function, and pain controlled with minimal PO medication. She has no other complaints today.    Review of systems: Constitutional:  She has no weight gain or weight loss. She has no fever or chills. Eyes: No blurred vision Ears, Nose, Mouth, Throat: No dizziness, headaches or changes in hearing. No mouth sores. Cardiovascular: No chest pain, palpitations or edema. Respiratory:  No shortness of breath, wheezing or cough Gastrointestinal: She has normal bowel movements without diarrhea or constipation. She denies any nausea or vomiting. She denies blood in her stool or heart burn. Genitourinary:  She denies pelvic pain, pelvic pressure or changes in her urinary function. She has no hematuria, dysuria, or incontinence. She has no irregular vaginal bleeding or vaginal discharge Musculoskeletal: Denies muscle weakness or joint pains.  Skin:  She has no skin changes, rashes or itching Neurological:  Denies dizziness or headaches. No neuropathy, no numbness or tingling. Psychiatric:  She denies depression or anxiety. Hematologic/Lymphatic:   No easy bruising or bleeding   Physical Exam: Blood pressure 116/72, pulse 74, temperature 97.8 F (36.6 C), temperature source Oral, resp. rate 18, height 5\' 1"  (1.549 m), weight 124 lb 4.8 oz (56.4 kg), SpO2 100 %. General: Well dressed, well nourished in no apparent distress.    HEENT:  Normocephalic and atraumatic, no lesions.  Extraocular muscles intact. Sclerae anicteric. Pupils equal, round, reactive. No mouth sores or ulcers. Thyroid is normal size, not nodular, midline. Abdomen:  Soft, nontender, nondistended.  No palpable masses.  No hepatosplenomegaly.  No ascites. Normal bowel sounds.  No hernias.  Incisions are well healed Genitourinary: deferred Extremities: No cyanosis, clubbing or edema.  No calf tenderness or erythema. No palpable cords. Psychiatric: Mood and affect are appropriate. Neurological: Awake, alert and oriented x 3. Sensation is intact, no neuropathy.  Musculoskeletal: No pain, normal strength and range of motion.  Assessment:    45 y.o. year old with a history of left ovarian cyst (15cm) which was a mucinous cystadenoma.   S/p robotic left salpingo-oophorectomy on 07/12/15.   Plan: 1) Pathology reports reviewed today 2) Treatment counseling - I discussed the benign nature of the lesion. She requires no additional workup for this. She was given the opportunity to ask questions, which were answered to her satisfaction, and she is agreement with the above mentioned plan of care.  3)  Return to clinic on a prn basis. She should continue to see Dr Nyoka Cowden each year for follow-up.  Donaciano Eva, MD

## 2016-12-14 SURGERY — Surgical Case
Anesthesia: *Unknown
